# Patient Record
Sex: Male | Born: 1935 | Race: Black or African American | Hispanic: No | Marital: Single
Health system: Southern US, Community
[De-identification: ages and names within clinical notes are randomized; demographics above are authoritative.]

## PROBLEM LIST (undated history)

## (undated) DIAGNOSIS — M1990 Unspecified osteoarthritis, unspecified site: Secondary | ICD-10-CM

## (undated) DIAGNOSIS — N400 Enlarged prostate without lower urinary tract symptoms: Secondary | ICD-10-CM

## (undated) DIAGNOSIS — I2510 Atherosclerotic heart disease of native coronary artery without angina pectoris: Secondary | ICD-10-CM

## (undated) HISTORY — PX: THORACOTOMY: SUR1349

## (undated) HISTORY — PX: COLONOSCOPY: SHX174

---

## 2019-07-13 ENCOUNTER — Other Ambulatory Visit: Payer: Self-pay

## 2019-07-13 ENCOUNTER — Emergency Department (HOSPITAL_COMMUNITY)
Admission: EM | Admit: 2019-07-13 | Discharge: 2019-07-13 | Disposition: A | Discharge status: Home / Self Care | Payer: Medicare Other | Attending: Emergency Medicine | Admitting: Emergency Medicine

## 2019-07-13 ENCOUNTER — Encounter (HOSPITAL_COMMUNITY): Payer: Self-pay

## 2019-07-13 DIAGNOSIS — R1030 Lower abdominal pain, unspecified: Secondary | ICD-10-CM

## 2019-07-13 DIAGNOSIS — F0390 Unspecified dementia without behavioral disturbance: Secondary | ICD-10-CM | POA: Diagnosis not present

## 2019-07-13 DIAGNOSIS — Z79899 Other long term (current) drug therapy: Secondary | ICD-10-CM | POA: Diagnosis not present

## 2019-07-13 HISTORY — DX: Atherosclerotic heart disease of native coronary artery without angina pectoris: I25.10

## 2019-07-13 HISTORY — DX: Unspecified osteoarthritis, unspecified site: M19.90

## 2019-07-13 HISTORY — DX: Benign prostatic hyperplasia without lower urinary tract symptoms: N40.0

## 2019-07-13 LAB — CBC WITH DIFFERENTIAL/PLATELET
Abs Immature Granulocytes: 0.01 10*3/uL (ref 0.00–0.07)
Basophils Absolute: 0 10*3/uL (ref 0.0–0.1)
Basophils Relative: 0 %
Eosinophils Absolute: 0.3 10*3/uL (ref 0.0–0.5)
Eosinophils Relative: 6 %
HCT: 39.3 % (ref 39.0–52.0)
Hemoglobin: 12.9 g/dL — ABNORMAL LOW (ref 13.0–17.0)
Immature Granulocytes: 0 %
Lymphocytes Relative: 25 %
Lymphs Abs: 1.2 10*3/uL (ref 0.7–4.0)
MCH: 30.8 pg (ref 26.0–34.0)
MCHC: 32.8 g/dL (ref 30.0–36.0)
MCV: 93.8 fL (ref 80.0–100.0)
Monocytes Absolute: 0.4 10*3/uL (ref 0.1–1.0)
Monocytes Relative: 8 %
Neutro Abs: 3.1 10*3/uL (ref 1.7–7.7)
Neutrophils Relative %: 61 %
Platelets: 212 10*3/uL (ref 150–400)
RBC: 4.19 MIL/uL — ABNORMAL LOW (ref 4.22–5.81)
RDW: 13.3 % (ref 11.5–15.5)
WBC: 5 10*3/uL (ref 4.0–10.5)
nRBC: 0 % (ref 0.0–0.2)

## 2019-07-13 LAB — COMPREHENSIVE METABOLIC PANEL
ALT: 8 U/L (ref 0–44)
AST: 30 U/L (ref 15–41)
Albumin: 4.3 g/dL (ref 3.5–5.0)
Alkaline Phosphatase: 65 U/L (ref 38–126)
Anion gap: 9 (ref 5–15)
BUN: 14 mg/dL (ref 8–23)
CO2: 25 mmol/L (ref 22–32)
Calcium: 9.3 mg/dL (ref 8.9–10.3)
Chloride: 105 mmol/L (ref 98–111)
Creatinine, Ser: 0.88 mg/dL (ref 0.61–1.24)
GFR calc Af Amer: >60 mL/min (ref >60)
GFR calc non Af Amer: >60 mL/min (ref >60)
Glucose, Bld: 102 mg/dL — ABNORMAL HIGH (ref 70–99)
Potassium: 4.6 mmol/L (ref 3.5–5.1)
Sodium: 139 mmol/L (ref 135–145)
Total Bilirubin: 0.7 mg/dL (ref 0.3–1.2)
Total Protein: 7.9 g/dL (ref 6.5–8.1)

## 2019-07-13 LAB — URINALYSIS, ROUTINE W REFLEX MICROSCOPIC
Bilirubin Urine: NEGATIVE
Glucose, UA: NEGATIVE mg/dL
Hgb urine dipstick: NEGATIVE
Ketones, ur: NEGATIVE mg/dL
Leukocytes,Ua: NEGATIVE
Nitrite: NEGATIVE
Protein, ur: NEGATIVE mg/dL
Specific Gravity, Urine: 1.012 (ref 1.005–1.030)
pH: 7 (ref 5.0–8.0)

## 2019-07-13 MED ORDER — SODIUM CHLORIDE 0.9 % IV BOLUS: 500.0000 mL | Freq: Once | INTRAVENOUS | Status: DC

## 2019-07-13 MED ORDER — LIDOCAINE 5 % EX PTCH: 1.0000 | MEDICATED_PATCH | CUTANEOUS | 0 refills | Status: AC

## 2019-07-13 MED ORDER — TRAMADOL HCL 50 MG PO TABS: 50.0000 mg | ORAL_TABLET | Freq: Two times a day (BID) | ORAL | 0 refills | Status: DC | PRN

## 2019-07-13 NOTE — ED Notes (Signed)
Pt becoming more confused and agitated.  Talking to sister who is not in the room.   Ready for DC.

## 2019-07-13 NOTE — ED Provider Notes (Addendum)
[facility] EMERGENCY DEPARTMENT Provider Note   CSN: [id number] Arrival date & time: 07/13/19  1235     History   Chief Complaint No chief complaint on file.   HPI Randy Chandler is a 85 y.o. male.     Patient brought by EMS from [facility] memory care facility.  Patient with history of dementia.  Reported lower abdominal pain over the past 3 days with difficulty in voiding.  Patient records show history of enlarged prostate.  No reported fevers, nausea, vomiting, diarrhea.  No blood in the urine.  Level 5 caveat due to dementia.     No past medical history on file.  There are no active problems to display for this patient.   The histories are not reviewed yet. Please review them in the "History" navigator section and refresh this Crown.      Home Medications    Prior to Admission medications   Medication Sig Start Date End Date Taking? Authorizing Provider  acetaminophen (TYLENOL) 500 MG tablet Take 1,000 mg by mouth 2 (two) times daily.   Yes [provider]  amLODipine (NORVASC) 2.5 MG tablet Take 2.5 mg by mouth daily.   Yes [provider]  dorzolamide-timolol (COSOPT) 22.3-6.8 MG/ML ophthalmic solution Place 1 drop into both eyes 2 (two) times daily.   Yes [provider]  latanoprost (XALATAN) 0.005 % ophthalmic solution Place 1 drop into both eyes at bedtime.   Yes [provider]  LORazepam (ATIVAN) 0.5 MG tablet Take 0.5 mg by mouth at bedtime.   Yes [provider]  naproxen sodium (ALEVE) 220 MG tablet Take 220 mg by mouth 2 (two) times daily.   Yes [provider]  QUEtiapine (SEROQUEL) 25 MG tablet Take 12.5-50 mg by mouth See admin instructions. 12.5mg  in the morning and 50mg  at bedtime   Yes [provider]  tamsulosin (FLOMAX) 0.4 MG CAPS capsule Take 0.4 mg by mouth daily after supper.   Yes [provider]    Family History No family history on file.   Social History Social History   Tobacco Use  . Smoking status: Not on file  Substance Use Topics  . Alcohol use: Not on file  . Drug use: Not on file     Allergies   Patient has no allergy information on record.   Review of Systems Review of Systems  Unable to perform ROS: Dementia     Physical Exam Updated Vital Signs BP (!) 170/97   Pulse 69   Temp 98.2 F (36.8 C) (Oral)   Resp 16   SpO2 97%   Physical Exam Vitals signs and nursing note reviewed.  Constitutional:      Appearance: He is well-developed.  HENT:     Head: Normocephalic and atraumatic.  Eyes:     General:        Right eye: No discharge.        Left eye: No discharge.     Conjunctiva/sclera: Conjunctivae normal.  Neck:     Musculoskeletal: Normal range of motion and neck supple.  Cardiovascular:     Rate and Rhythm: Normal rate and regular rhythm.     Heart sounds: Normal heart sounds.  Pulmonary:     Effort: Pulmonary effort is normal.     Breath sounds: Normal breath sounds.  Abdominal:     Palpations: Abdomen is soft.     Tenderness: There is no abdominal tenderness.     Comments: No tenderness  to palpation.  Skin:    General: Skin is warm and dry.  Neurological:     General: No focal deficit present.     Mental Status: He is alert.     Comments: Patient is pleasant, somewhat confused at baseline.      ED Treatments / Results  Labs (all labs ordered are listed, but only abnormal results are displayed) Labs Reviewed  CBC WITH DIFFERENTIAL/PLATELET - Abnormal; Notable for the following components:      Result Value   RBC 4.19 (*)    Hemoglobin 12.9 (*)    All other components within normal limits  COMPREHENSIVE METABOLIC PANEL - Abnormal; Notable for the following components:   Glucose, Bld 102 (*)    All other components within normal limits  URINALYSIS, ROUTINE W REFLEX MICROSCOPIC - Abnormal; Notable for the following components:   Color, Urine STRAW (*)    All other  components within normal limits    EKG None  Radiology No results found.  Procedures Procedures (including critical care time)  Medications Ordered in ED Medications  sodium chloride 0.9 % bolus 500 mL (500 mLs Intravenous Not Given 07/13/19 1417)     Initial Impression / Assessment and Plan / ED Course  I have reviewed the triage vital signs and the nursing notes.  Pertinent labs & imaging results that were available during my care of the patient were reviewed by me and considered in my medical decision making (see chart for details).        Patient seen and examined. Work-up initiated. Medications reviewed.   Vital signs reviewed and are as follows: BP (!) 170/97   Pulse 69   Temp 98.2 F (36.8 C) (Oral)   Resp 16   SpO2 97%   2:44 PM patient discussed with and seen by Dr. Vanita Panda.  CBC, CMP, UA are all normal.  No signs of infection.  Abdomen is soft and nontender.  No indications for admission at this time.  No indications for CT or further work-up now. Will d/c back to facility.   The patient was urged to return to the Emergency Department immediately with worsening of current symptoms, worsening abdominal pain, persistent vomiting, blood noted in stools, fever, or any other concerns. The patient verbalized understanding.   3:48 PM I had a good discussion with patient's daughter who is an Administrator, [employer].    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  Patient is noted to wince when he sits up on the side of the bed.  We discussed all results regarding his abdominal work-up today and she is in agreement that we do not need to do further evaluation.  Patient has used Lidoderm patches and tramadol in the past.  He has not had any complications from the tramadol.  Will prescribe both Lidoderm and tramadol.  Tramadol is to only be used in case of severe breakthrough pain not relieved with  prescribed naproxen, acetaminophen, and Lidoderm patches.  Daughter is in agreement with this.  He has a follow-up on Wednesday.  She also noted at the facility is new for him and they do not know him that well yet.   Final Clinical Impressions(s) / ED Diagnoses   Final diagnoses:  Lower abdominal pain   Patient with abdominal pain. Vitals are stable, no fever. Labs normal. Imaging not felt indicated . No signs of dehydration, patient is tolerating PO's. Lungs are  clear and no signs suggestive of PNA. Low concern for appendicitis, cholecystitis, pancreatitis, ruptured viscus, UTI, kidney stone, aortic dissection, aortic aneurysm or other emergent abdominal etiology. Supportive therapy indicated with return if symptoms worsen.      ED Discharge Orders         Ordered    traMADol (ULTRAM) 50 MG tablet  Every 12 hours PRN     07/13/19 1545    lidocaine (LIDODERM) 5 %  Every 24 hours     07/13/19 1545             Carlisle Cater, PA-C 07/13/19 1552    Carmin Muskrat, MD 07/15/19 1423

## 2019-07-13 NOTE — ED Triage Notes (Signed)
C/o 3 day hx of lower abd pain and groin tenderness.  Difficulty in emptying bladder.  No N/V/D.  Pt alert to person only, cooperative but does not want to get undressed.  No outward pain distress.

## 2019-07-13 NOTE — Discharge Instructions (Signed)
Please read and follow all provided instructions.  Your diagnoses today include:  1. Lower abdominal pain     Tests performed today include:  Blood counts and electrolytes  Blood tests to check liver and kidney function  Urine test to look for infection - no infection  Vital signs. See below for your results today.   Medications prescribed:   None  Take any prescribed medications only as directed.  Home care instructions:   Follow any educational materials contained in this packet.  Follow-up instructions: Please follow-up with your primary care provider in the next 3 days for further evaluation of your symptoms if not getting better.   Return instructions:  SEEK IMMEDIATE MEDICAL ATTENTION IF:  The pain does not go away or becomes severe   A temperature above 101F develops   Repeated vomiting occurs (multiple episodes)   The pain becomes localized to portions of the abdomen. The right side could possibly be appendicitis. In an adult, the left lower portion of the abdomen could be colitis or diverticulitis.   Blood is being passed in stools or vomit (bright red or black tarry stools)   You develop chest pain, difficulty breathing, dizziness or fainting, or become confused, poorly responsive, or inconsolable (young children)  If you have any other emergent concerns regarding your health  Additional Information: Abdominal (belly) pain can be caused by many things. Your caregiver performed an examination and possibly ordered blood/urine tests and imaging (CT scan, x-rays, ultrasound). Many cases can be observed and treated at home after initial evaluation in the emergency department. Even though you are being discharged home, abdominal pain can be unpredictable. Therefore, you need a repeated exam if your pain does not resolve, returns, or worsens. Most patients with abdominal pain don't have to be admitted to the hospital or have surgery, but serious problems like  appendicitis and gallbladder attacks can start out as nonspecific pain. Many abdominal conditions cannot be diagnosed in one visit, so follow-up evaluations are very important.  Your vital signs today were: BP (!) 170/97    Pulse 69    Temp 98.2 F (36.8 C) (Oral)    Resp 16    SpO2 97%  If your blood pressure (bp) was elevated above 135/85 this visit, please have this repeated by your doctor within one month. --------------

## 2019-07-13 NOTE — ED Notes (Signed)
Pt attempting to get up OOB, able to redirect and get dressed.  [facility] called to transport back to [facility].  Pt had also pulled off condom cath and was incontinent of urine.   Attends applied.

## 2019-07-13 NOTE — ED Notes (Signed)
Pt able to urinate with condom cath in place.   Does c/o pain with emptying bladder.  Attempt to flush IV and area infiltrated.  No need for bolus now that pt has given sample.

## 2019-07-13 NOTE — ED Notes (Addendum)
Daughter called in for update, Merrily Pew PA was able to speak with her.  Lunch offered to patient with fluids.   Able to eat without gross need for (A).

## 2019-09-08 ENCOUNTER — Other Ambulatory Visit: Payer: Self-pay

## 2019-09-08 ENCOUNTER — Encounter (HOSPITAL_COMMUNITY): Payer: Self-pay

## 2019-09-08 ENCOUNTER — Emergency Department (HOSPITAL_COMMUNITY)
Admission: EM | Admit: 2019-09-08 | Discharge: 2019-09-08 | Disposition: A | Discharge status: Home / Self Care | Payer: Medicare Other | Attending: Emergency Medicine | Admitting: Emergency Medicine

## 2019-09-08 DIAGNOSIS — Z79899 Other long term (current) drug therapy: Secondary | ICD-10-CM | POA: Diagnosis not present

## 2019-09-08 DIAGNOSIS — I2510 Atherosclerotic heart disease of native coronary artery without angina pectoris: Secondary | ICD-10-CM | POA: Diagnosis not present

## 2019-09-08 DIAGNOSIS — R319 Hematuria, unspecified: Secondary | ICD-10-CM | POA: Diagnosis present

## 2019-09-08 DIAGNOSIS — N3091 Cystitis, unspecified with hematuria: Secondary | ICD-10-CM | POA: Diagnosis not present

## 2019-09-08 LAB — URINALYSIS, ROUTINE W REFLEX MICROSCOPIC
Bilirubin Urine: NEGATIVE
Glucose, UA: NEGATIVE mg/dL
Ketones, ur: NEGATIVE mg/dL
Nitrite: NEGATIVE
Protein, ur: 30 mg/dL — AB
RBC / HPF: >50 RBC/hpf — ABNORMAL HIGH (ref 0–5)
Specific Gravity, Urine: 1.013 (ref 1.005–1.030)
WBC, UA: >50 WBC/hpf — ABNORMAL HIGH (ref 0–5)
pH: 8 (ref 5.0–8.0)

## 2019-09-08 LAB — BASIC METABOLIC PANEL
Anion gap: 7 (ref 5–15)
BUN: 16 mg/dL (ref 8–23)
CO2: 26 mmol/L (ref 22–32)
Calcium: 9.4 mg/dL (ref 8.9–10.3)
Chloride: 105 mmol/L (ref 98–111)
Creatinine, Ser: 0.77 mg/dL (ref 0.61–1.24)
GFR calc Af Amer: >60 mL/min (ref >60)
GFR calc non Af Amer: >60 mL/min (ref >60)
Glucose, Bld: 87 mg/dL (ref 70–99)
Potassium: 3.8 mmol/L (ref 3.5–5.1)
Sodium: 138 mmol/L (ref 135–145)

## 2019-09-08 LAB — CBC WITH DIFFERENTIAL/PLATELET
Abs Immature Granulocytes: 0.01 10*3/uL (ref 0.00–0.07)
Basophils Absolute: 0 10*3/uL (ref 0.0–0.1)
Basophils Relative: 0 %
Eosinophils Absolute: 0.3 10*3/uL (ref 0.0–0.5)
Eosinophils Relative: 6 %
HCT: 39.5 % (ref 39.0–52.0)
Hemoglobin: 12.7 g/dL — ABNORMAL LOW (ref 13.0–17.0)
Immature Granulocytes: 0 %
Lymphocytes Relative: 26 %
Lymphs Abs: 1.3 10*3/uL (ref 0.7–4.0)
MCH: 30.5 pg (ref 26.0–34.0)
MCHC: 32.2 g/dL (ref 30.0–36.0)
MCV: 95 fL (ref 80.0–100.0)
Monocytes Absolute: 0.5 10*3/uL (ref 0.1–1.0)
Monocytes Relative: 9 %
Neutro Abs: 2.9 10*3/uL (ref 1.7–7.7)
Neutrophils Relative %: 59 %
Platelets: 246 10*3/uL (ref 150–400)
RBC: 4.16 MIL/uL — ABNORMAL LOW (ref 4.22–5.81)
RDW: 13.8 % (ref 11.5–15.5)
WBC: 5 10*3/uL (ref 4.0–10.5)
nRBC: 0 % (ref 0.0–0.2)

## 2019-09-08 MED ORDER — CEPHALEXIN 500 MG PO CAPS: 500.0000 mg | ORAL_CAPSULE | Freq: Two times a day (BID) | ORAL | 0 refills | Status: DC

## 2019-09-08 MED ORDER — STERILE WATER FOR INJECTION IJ SOLN
INTRAMUSCULAR | Status: AC
  Administered 2019-09-08: 05:00:00
  Filled 2019-09-08: qty 10

## 2019-09-08 MED ORDER — CEFTRIAXONE SODIUM 1 G IJ SOLR
1.0000 g | Freq: Once | INTRAMUSCULAR | Status: AC
  Administered 2019-09-08: 1 g via INTRAMUSCULAR
  Filled 2019-09-08: qty 10

## 2019-09-08 NOTE — ED Triage Notes (Signed)
BIB from EMS from [facility]. Pt complains of bilateral knee pain and hip pain. Pt normally ambulatory. Facility reports hematuria during an incontinent episode. Pt denies painful urination or abdominal pain.

## 2019-09-08 NOTE — ED Notes (Signed)
[facility] Called. Paperwork printed

## 2019-09-08 NOTE — ED Notes (Signed)
Discharge instruction given to [place], Building control surveyor at [facility]

## 2019-09-08 NOTE — ED Provider Notes (Signed)
[facility] DEPT Provider Note   CSN: [id number] Arrival date & time: 09/08/19  0240     History   Chief Complaint Chief Complaint  Patient presents with  . Knee Pain  . Hematuria    HPI Randy Chandler is a 85 y.o. male.     Patient sent to the ER from nursing facility for evaluation of hematuria.  Patient reportedly had an episode of incontinence earlier tonight and staff noticed blood in the urine.  Patient was unaware of this.  He seems slightly confused at arrival as to why he is here.  When asked why he is here he starts to indicate multiple areas of what appears to be chronic pain for him.  He reports that both of his knees hurt and also has had some pain with his left elbow.  Denies any trauma.     Past Medical History:  Diagnosis Date  . Arthritis   . BPH (benign prostatic hyperplasia)   . Coronary artery disease   . Osteoarthritis     There are no active problems to display for this patient.         Home Medications    Prior to Admission medications   Medication Sig Start Date End Date Taking? Authorizing Provider  acetaminophen (TYLENOL) 500 MG tablet Take 1,000 mg by mouth 2 (two) times daily.   Yes [provider]  amLODipine (NORVASC) 2.5 MG tablet Take 2.5 mg by mouth daily.   Yes [provider]  dorzolamide-timolol (COSOPT) 22.3-6.8 MG/ML ophthalmic solution Place 1 drop into both eyes 2 (two) times daily.   Yes [provider]  latanoprost (XALATAN) 0.005 % ophthalmic solution Place 1 drop into both eyes at bedtime.   Yes [provider]  LORazepam (ATIVAN) 0.5 MG tablet Take 0.5 mg by mouth at bedtime.   Yes [provider]  naproxen sodium (ALEVE) 220 MG tablet Take 220 mg by mouth 2 (two) times daily.   Yes [provider]  QUEtiapine (SEROQUEL) 25 MG tablet Take 12.5-50 mg by mouth See admin instructions. 12.5mg  in the morning and 50mg  at bedtime   Yes [provider]  tamsulosin (FLOMAX) 0.4 MG CAPS capsule Take 0.4 mg by mouth daily after supper.   Yes [provider]  traMADol (ULTRAM) 50 MG tablet Take 1 tablet (50 mg total) by mouth every 12 (twelve) hours as needed for severe pain (use for breakthrough pain if other medications are not helping). 07/13/19  Yes Carlisle Cater, PA-C  cephALEXin (KEFLEX) 500 MG capsule Take 1 capsule (500 mg total) by mouth 2 (two) times daily. 09/08/19   Orpah Greek, MD  lidocaine (LIDODERM) 5 % Place 1 patch onto the skin daily. Remove & Discard patch within 12 hours or as directed by MD 07/13/19   Carlisle Cater, PA-C    Family History No family history on file.  Social History Social History   Tobacco Use  . Smoking status: Not on file  Substance Use Topics  . Alcohol use: Not on file  . Drug use: Not on file     Allergies   Hytrin [terazosin]   Review of Systems Review of Systems  Genitourinary: Positive for hematuria.  Musculoskeletal: Positive for arthralgias.  All other systems reviewed and are negative.    Physical Exam Updated Vital Signs BP (!) 163/94   Pulse 64   Temp 97.9 F (36.6 C) (Oral)   Resp 13   SpO2 100%  Physical Exam Vitals signs and nursing note reviewed.  Constitutional:      General: He is not in acute distress.    Appearance: Normal appearance. He is well-developed.  HENT:     Head: Normocephalic and atraumatic.     Right Ear: Hearing normal.     Left Ear: Hearing normal.     Nose: Nose normal.  Eyes:     Conjunctiva/sclera: Conjunctivae normal.     Pupils: Pupils are equal, round, and reactive to light.  Neck:     Musculoskeletal: Normal range of motion and neck supple.  Cardiovascular:     Rate and Rhythm: Regular rhythm.     Heart sounds: S1 normal and S2 normal. No murmur. No friction rub. No gallop.   Pulmonary:     Effort: Pulmonary effort is normal. No respiratory distress.     Breath sounds: Normal breath sounds.   Chest:     Chest wall: No tenderness.  Abdominal:     General: Bowel sounds are normal.     Palpations: Abdomen is soft.     Tenderness: There is no abdominal tenderness. There is no guarding or rebound. Negative signs include Murphy's sign and McBurney's sign.     Hernia: No hernia is present.  Musculoskeletal: Normal range of motion.     Left elbow: He exhibits normal range of motion, no swelling and no deformity.     Right knee: He exhibits normal range of motion, no swelling, no effusion, no deformity and no erythema.     Left knee: He exhibits normal range of motion, no swelling, no effusion, no deformity and no erythema.  Skin:    General: Skin is warm and dry.     Findings: No rash.  Neurological:     Mental Status: He is alert and oriented to person, place, and time.     GCS: GCS eye subscore is 4. GCS verbal subscore is 5. GCS motor subscore is 6.     Cranial Nerves: No cranial nerve deficit.     Sensory: No sensory deficit.     Coordination: Coordination normal.  Psychiatric:        Speech: Speech normal.        Behavior: Behavior normal.        Thought Content: Thought content normal.      ED Treatments / Results  Labs (all labs ordered are listed, but only abnormal results are displayed) Labs Reviewed  CBC WITH DIFFERENTIAL/PLATELET - Abnormal; Notable for the following components:      Result Value   RBC 4.16 (*)    Hemoglobin 12.7 (*)    All other components within normal limits  URINALYSIS, ROUTINE W REFLEX MICROSCOPIC - Abnormal; Notable for the following components:   APPearance HAZY (*)    Hgb urine dipstick LARGE (*)    Protein, ur 30 (*)    Leukocytes,Ua MODERATE (*)    RBC / HPF >50 (*)    WBC, UA >50 (*)    Bacteria, UA FEW (*)    All other components within normal limits  URINE CULTURE  BASIC METABOLIC PANEL    EKG None  Radiology No results found.  Procedures Procedures (including critical care time)  Medications Ordered in ED  Medications  cefTRIAXone (ROCEPHIN) injection 1 g (has no administration in time range)  sterile water (preservative free) injection (has no administration in time range)     Initial Impression / Assessment and Plan / ED Course  I have reviewed  the triage vital signs and the nursing notes.  Pertinent labs & imaging results that were available during my care of the patient were reviewed by me and considered in my medical decision making (see chart for details).        Patient sent to the emergency department for evaluation of hematuria.  This was noticed tonight when he had an episode of urinary incontinence and staff noticed blood.  Patient was unaware of this.  He was not actually sure why he was here in the ER.  His only complaints was various areas of pain secondary to his chronic arthritis.  He complained of bilateral knee and left elbow pain.  Examination of these areas did not reveal any erythema, warmth, swelling, effusions or any concern for joint infection.  He has not had any trauma.  Lab work was very reassuring but his urinalysis does show hematuria with obvious infection.  As he appears well, no concern for sepsis, vital signs are stable, appropriate for management at the nursing home.  Final Clinical Impressions(s) / ED Diagnoses   Final diagnoses:  Hemorrhagic cystitis    ED Discharge Orders         Ordered    cephALEXin (KEFLEX) 500 MG capsule  2 times daily     09/08/19 0515           Orpah Greek, MD 09/08/19 [phone]

## 2019-09-08 NOTE — ED Notes (Signed)
Called lab to add Urine Culture.

## 2019-09-08 NOTE — ED Notes (Signed)
Pt provided with peri care and fresh linens after arriving to ED in a soiled brief. Blood noted and Seth Bake, RN made aware. Pt provided with a condom cath for voiding needs. Pt has call bell within reach. Given warm blankets.

## 2019-09-08 NOTE — ED Notes (Signed)
Attempted to call report to [facility] with no answer  

## 2019-09-10 LAB — URINE CULTURE: Culture: >=100000 — AB

## 2019-09-11 ENCOUNTER — Telehealth: Payer: Self-pay

## 2019-09-11 NOTE — Telephone Encounter (Signed)
Post ED Visit - Positive Culture Follow-up  Culture report reviewed by antimicrobial stewardship pharmacist: [facility] []  [address] Pharm.D. []  Heide Guile, Pharm.D., BCPS AQ-ID []  Parks Neptune, Pharm.D., BCPS []  Alycia Rossetti, Pharm.D., BCPS []  [place], Pharm.D., BCPS, AAHIVP []  Legrand Como, Pharm.D., BCPS, AAHIVP []  Salome Arnt, PharmD, BCPS []  Johnnette Gourd, PharmD, BCPS []  Hughes Better, PharmD, BCPS []  Leeroy Cha, PharmD []  Laqueta Linden, PharmD, BCPS []  Albertina Parr, PharmD  [facility] Team [x]  Leodis Sias, PharmD []  Lindell Spar, PharmD []  Royetta Asal, PharmD []  Graylin Shiver, Rph []  Rema Fendt) Glennon Mac, PharmD []  Arlyn Dunning, PharmD []  Netta Cedars, PharmD []  Dia Sitter, PharmD []  Leone Haven, PharmD []  Gretta Arab, PharmD []  Theodis Shove, PharmD []  Peggyann Juba, PharmD []  Reuel Boom, PharmD   Positive urine culture Treated with Cephalexin, organism sensitive to the same and no further patient follow-up is required at this time.  Genia Del 09/11/2019, 8:48 AM

## 2019-10-25 ENCOUNTER — Emergency Department (HOSPITAL_COMMUNITY): Payer: Medicare Other

## 2019-10-25 ENCOUNTER — Encounter (HOSPITAL_COMMUNITY): Payer: Self-pay | Admitting: *Deleted

## 2019-10-25 ENCOUNTER — Inpatient Hospital Stay (HOSPITAL_COMMUNITY)
Admission: EM | Admit: 2019-10-25 | Discharge: 2019-11-05 | DRG: 177 | Disposition: A | Discharge status: Skilled Nursing Facility | Payer: Medicare Other | Source: Skilled Nursing Facility | Attending: Internal Medicine | Admitting: Internal Medicine

## 2019-10-25 DIAGNOSIS — E861 Hypovolemia: Secondary | ICD-10-CM | POA: Diagnosis present

## 2019-10-25 DIAGNOSIS — M1990 Unspecified osteoarthritis, unspecified site: Secondary | ICD-10-CM | POA: Diagnosis present

## 2019-10-25 DIAGNOSIS — E162 Hypoglycemia, unspecified: Secondary | ICD-10-CM | POA: Diagnosis present

## 2019-10-25 DIAGNOSIS — C8510 Unspecified B-cell lymphoma, unspecified site: Secondary | ICD-10-CM | POA: Diagnosis present

## 2019-10-25 DIAGNOSIS — Z833 Family history of diabetes mellitus: Secondary | ICD-10-CM | POA: Diagnosis not present

## 2019-10-25 DIAGNOSIS — R531 Weakness: Secondary | ICD-10-CM | POA: Diagnosis not present

## 2019-10-25 DIAGNOSIS — Z79899 Other long term (current) drug therapy: Secondary | ICD-10-CM

## 2019-10-25 DIAGNOSIS — Z888 Allergy status to other drugs, medicaments and biological substances status: Secondary | ICD-10-CM | POA: Diagnosis not present

## 2019-10-25 DIAGNOSIS — G9341 Metabolic encephalopathy: Secondary | ICD-10-CM | POA: Diagnosis present

## 2019-10-25 DIAGNOSIS — E870 Hyperosmolality and hypernatremia: Secondary | ICD-10-CM | POA: Diagnosis present

## 2019-10-25 DIAGNOSIS — Z800 Family history of malignant neoplasm of digestive organs: Secondary | ICD-10-CM

## 2019-10-25 DIAGNOSIS — N400 Enlarged prostate without lower urinary tract symptoms: Secondary | ICD-10-CM | POA: Diagnosis present

## 2019-10-25 DIAGNOSIS — R627 Adult failure to thrive: Secondary | ICD-10-CM | POA: Diagnosis not present

## 2019-10-25 DIAGNOSIS — R5381 Other malaise: Secondary | ICD-10-CM | POA: Diagnosis present

## 2019-10-25 DIAGNOSIS — E785 Hyperlipidemia, unspecified: Secondary | ICD-10-CM | POA: Diagnosis present

## 2019-10-25 DIAGNOSIS — Z8249 Family history of ischemic heart disease and other diseases of the circulatory system: Secondary | ICD-10-CM | POA: Diagnosis not present

## 2019-10-25 DIAGNOSIS — E860 Dehydration: Secondary | ICD-10-CM | POA: Diagnosis present

## 2019-10-25 DIAGNOSIS — Z791 Long term (current) use of non-steroidal anti-inflammatories (NSAID): Secondary | ICD-10-CM

## 2019-10-25 DIAGNOSIS — F0280 Dementia in other diseases classified elsewhere without behavioral disturbance: Secondary | ICD-10-CM | POA: Diagnosis present

## 2019-10-25 DIAGNOSIS — U071 COVID-19: Principal | ICD-10-CM | POA: Diagnosis present

## 2019-10-25 DIAGNOSIS — R739 Hyperglycemia, unspecified: Secondary | ICD-10-CM | POA: Diagnosis not present

## 2019-10-25 DIAGNOSIS — I10 Essential (primary) hypertension: Secondary | ICD-10-CM | POA: Diagnosis present

## 2019-10-25 DIAGNOSIS — R0602 Shortness of breath: Secondary | ICD-10-CM

## 2019-10-25 DIAGNOSIS — Z79891 Long term (current) use of opiate analgesic: Secondary | ICD-10-CM

## 2019-10-25 DIAGNOSIS — Z6821 Body mass index (BMI) 21.0-21.9, adult: Secondary | ICD-10-CM

## 2019-10-25 DIAGNOSIS — Z66 Do not resuscitate: Secondary | ICD-10-CM | POA: Diagnosis present

## 2019-10-25 DIAGNOSIS — I2510 Atherosclerotic heart disease of native coronary artery without angina pectoris: Secondary | ICD-10-CM | POA: Diagnosis present

## 2019-10-25 DIAGNOSIS — E871 Hypo-osmolality and hyponatremia: Secondary | ICD-10-CM | POA: Diagnosis not present

## 2019-10-25 DIAGNOSIS — E878 Other disorders of electrolyte and fluid balance, not elsewhere classified: Secondary | ICD-10-CM | POA: Diagnosis present

## 2019-10-25 DIAGNOSIS — H409 Unspecified glaucoma: Secondary | ICD-10-CM | POA: Diagnosis present

## 2019-10-25 DIAGNOSIS — N179 Acute kidney failure, unspecified: Secondary | ICD-10-CM | POA: Diagnosis present

## 2019-10-25 LAB — URINALYSIS, ROUTINE W REFLEX MICROSCOPIC
Bacteria, UA: NONE SEEN
Bilirubin Urine: NEGATIVE
Glucose, UA: NEGATIVE mg/dL
Ketones, ur: NEGATIVE mg/dL
Leukocytes,Ua: NEGATIVE
Nitrite: NEGATIVE
Protein, ur: 100 mg/dL — AB
Specific Gravity, Urine: 1.025 (ref 1.005–1.030)
pH: 5 (ref 5.0–8.0)

## 2019-10-25 LAB — CBC WITH DIFFERENTIAL/PLATELET
Abs Immature Granulocytes: 0.11 10*3/uL — ABNORMAL HIGH (ref 0.00–0.07)
Basophils Absolute: 0 10*3/uL (ref 0.0–0.1)
Basophils Relative: 0 %
Eosinophils Absolute: 0 10*3/uL (ref 0.0–0.5)
Eosinophils Relative: 0 %
HCT: 47.9 % (ref 39.0–52.0)
Hemoglobin: 14.2 g/dL (ref 13.0–17.0)
Immature Granulocytes: 1 %
Lymphocytes Relative: 8 %
Lymphs Abs: 1.1 10*3/uL (ref 0.7–4.0)
MCH: 29.6 pg (ref 26.0–34.0)
MCHC: 29.6 g/dL — ABNORMAL LOW (ref 30.0–36.0)
MCV: 100 fL (ref 80.0–100.0)
Monocytes Absolute: 0.8 10*3/uL (ref 0.1–1.0)
Monocytes Relative: 6 %
Neutro Abs: 11.2 10*3/uL — ABNORMAL HIGH (ref 1.7–7.7)
Neutrophils Relative %: 85 %
Platelets: 351 10*3/uL (ref 150–400)
RBC: 4.79 MIL/uL (ref 4.22–5.81)
RDW: 14.1 % (ref 11.5–15.5)
WBC: 13.3 10*3/uL — ABNORMAL HIGH (ref 4.0–10.5)
nRBC: 0 % (ref 0.0–0.2)

## 2019-10-25 LAB — BASIC METABOLIC PANEL
Anion gap: 12 (ref 5–15)
Anion gap: 13 (ref 5–15)
BUN: 62 mg/dL — ABNORMAL HIGH (ref 8–23)
BUN: 52 mg/dL — ABNORMAL HIGH (ref 8–23)
CO2: 30 mmol/L (ref 22–32)
CO2: 25 mmol/L (ref 22–32)
Calcium: 10 mg/dL (ref 8.9–10.3)
Calcium: 9.7 mg/dL (ref 8.9–10.3)
Chloride: 120 mmol/L — ABNORMAL HIGH (ref 98–111)
Chloride: 126 mmol/L — ABNORMAL HIGH (ref 98–111)
Creatinine, Ser: 1.55 mg/dL — ABNORMAL HIGH (ref 0.61–1.24)
Creatinine, Ser: 1.48 mg/dL — ABNORMAL HIGH (ref 0.61–1.24)
GFR calc Af Amer: 46 mL/min — ABNORMAL LOW (ref >60)
GFR calc Af Amer: 49 mL/min — ABNORMAL LOW (ref >60)
GFR calc non Af Amer: 40 mL/min — ABNORMAL LOW (ref >60)
GFR calc non Af Amer: 42 mL/min — ABNORMAL LOW (ref >60)
Glucose, Bld: 110 mg/dL — ABNORMAL HIGH (ref 70–99)
Glucose, Bld: 111 mg/dL — ABNORMAL HIGH (ref 70–99)
Potassium: 4.5 mmol/L (ref 3.5–5.1)
Potassium: 4.8 mmol/L (ref 3.5–5.1)
Sodium: 162 mmol/L (ref 135–145)
Sodium: 164 mmol/L (ref 135–145)

## 2019-10-25 LAB — D-DIMER, QUANTITATIVE: D-Dimer, Quant: 18.29 ug/mL-FEU — ABNORMAL HIGH (ref 0.00–0.50)

## 2019-10-25 LAB — TSH: TSH: 1.85 u[IU]/mL (ref 0.350–4.500)

## 2019-10-25 LAB — HEPATITIS B SURFACE ANTIGEN: Hepatitis B Surface Ag: NONREACTIVE

## 2019-10-25 LAB — LACTIC ACID, PLASMA: Lactic Acid, Venous: 2.1 mmol/L (ref 0.5–1.9)

## 2019-10-25 LAB — CK: Total CK: 408 U/L — ABNORMAL HIGH (ref 49–397)

## 2019-10-25 LAB — PROCALCITONIN: Procalcitonin: 0.19 ng/mL

## 2019-10-25 LAB — GLUCOSE, CAPILLARY
Glucose-Capillary: 116 mg/dL — ABNORMAL HIGH (ref 70–99)
Glucose-Capillary: 91 mg/dL (ref 70–99)

## 2019-10-25 LAB — TROPONIN I (HIGH SENSITIVITY): Troponin I (High Sensitivity): 25 ng/L — ABNORMAL HIGH (ref <18)

## 2019-10-25 LAB — LIPASE, BLOOD: Lipase: 36 U/L (ref 11–51)

## 2019-10-25 LAB — SEDIMENTATION RATE: Sed Rate: 112 mm/hr — ABNORMAL HIGH (ref 0–16)

## 2019-10-25 LAB — C-REACTIVE PROTEIN: CRP: 21 mg/dL — ABNORMAL HIGH (ref <1.0)

## 2019-10-25 LAB — LACTATE DEHYDROGENASE: LDH: 321 U/L — ABNORMAL HIGH (ref 98–192)

## 2019-10-25 LAB — MRSA PCR SCREENING: MRSA by PCR: NEGATIVE

## 2019-10-25 LAB — FIBRINOGEN: Fibrinogen: >800 mg/dL — ABNORMAL HIGH (ref 210–475)

## 2019-10-25 LAB — FERRITIN: Ferritin: 1398 ng/mL — ABNORMAL HIGH (ref 24–336)

## 2019-10-25 MED ORDER — HEPARIN SODIUM (PORCINE) 5000 UNIT/ML IJ SOLN: 5000.0000 [IU] | Freq: Three times a day (TID) | INTRAMUSCULAR | Status: DC

## 2019-10-25 MED ORDER — ONDANSETRON HCL 4 MG/2ML IJ SOLN: 4.0000 mg | Freq: Four times a day (QID) | INTRAMUSCULAR | Status: DC | PRN

## 2019-10-25 MED ORDER — TRAZODONE HCL 50 MG PO TABS
50.0000 mg | ORAL_TABLET | Freq: Every day | ORAL | Status: DC
  Administered 2019-10-28 – 2019-10-29 (×2): 50 mg via ORAL
  Filled 2019-10-25 (×2): qty 1

## 2019-10-25 MED ORDER — ONDANSETRON HCL 4 MG PO TABS: 4.0000 mg | ORAL_TABLET | Freq: Four times a day (QID) | ORAL | Status: DC | PRN

## 2019-10-25 MED ORDER — POTASSIUM CL IN DEXTROSE 5% 20 MEQ/L IV SOLN
20.0000 meq | INTRAVENOUS | Status: DC
  Administered 2019-10-25: 20 meq via INTRAVENOUS
  Filled 2019-10-25: qty 1000

## 2019-10-25 MED ORDER — TRAMADOL HCL 50 MG PO TABS: 50.0000 mg | ORAL_TABLET | Freq: Two times a day (BID) | ORAL | Status: DC | PRN

## 2019-10-25 MED ORDER — HEPARIN SODIUM (PORCINE) 5000 UNIT/ML IJ SOLN
7500.0000 [IU] | Freq: Three times a day (TID) | INTRAMUSCULAR | Status: DC
  Administered 2019-10-25: 10000 [IU] via SUBCUTANEOUS
  Administered 2019-10-26 – 2019-11-02 (×22): 7500 [IU] via SUBCUTANEOUS
  Filled 2019-10-25 (×24): qty 2

## 2019-10-25 MED ORDER — LATANOPROST 0.005 % OP SOLN
1.0000 [drp] | Freq: Every day | OPHTHALMIC | Status: DC
  Administered 2019-10-25 – 2019-11-04 (×11): 1 [drp] via OPHTHALMIC
  Filled 2019-10-25 (×2): qty 2.5

## 2019-10-25 MED ORDER — ACETAMINOPHEN 325 MG PO TABS: 650.0000 mg | ORAL_TABLET | Freq: Four times a day (QID) | ORAL | Status: DC | PRN

## 2019-10-25 MED ORDER — DEXAMETHASONE SODIUM PHOSPHATE 10 MG/ML IJ SOLN
6.0000 mg | INTRAMUSCULAR | Status: DC
  Administered 2019-10-25 – 2019-10-26 (×2): 6 mg via INTRAVENOUS
  Filled 2019-10-25 (×2): qty 1

## 2019-10-25 MED ORDER — VITAMIN D3 25 MCG (1000 UNIT) PO TABS
5000.0000 [IU] | ORAL_TABLET | Freq: Every day | ORAL | Status: DC
  Administered 2019-10-29 – 2019-11-05 (×8): 5000 [IU] via ORAL
  Filled 2019-10-25 (×17): qty 5

## 2019-10-25 MED ORDER — SODIUM CHLORIDE 0.9 % IV BOLUS
1000.0000 mL | Freq: Once | INTRAVENOUS | Status: AC
  Administered 2019-10-25: 13:00:00 1000 mL via INTRAVENOUS

## 2019-10-25 MED ORDER — INSULIN ASPART 100 UNIT/ML ~~LOC~~ SOLN
0.0000 [IU] | SUBCUTANEOUS | Status: DC
  Administered 2019-10-26: 1 [IU] via SUBCUTANEOUS
  Administered 2019-10-26: 2 [IU] via SUBCUTANEOUS
  Administered 2019-10-27 (×3): 1 [IU] via SUBCUTANEOUS

## 2019-10-25 MED ORDER — SODIUM CHLORIDE 0.9% IV SOLUTION: Freq: Once | INTRAVENOUS | Status: AC

## 2019-10-25 MED ORDER — QUETIAPINE FUMARATE 25 MG PO TABS: 50.0000 mg | ORAL_TABLET | Freq: Every day | ORAL | Status: DC

## 2019-10-25 MED ORDER — QUETIAPINE FUMARATE 25 MG PO TABS
12.5000 mg | ORAL_TABLET | Freq: Every day | ORAL | Status: DC
  Administered 2019-10-29 – 2019-10-30 (×2): 12.5 mg via ORAL
  Filled 2019-10-25 (×3): qty 1

## 2019-10-25 MED ORDER — TAMSULOSIN HCL 0.4 MG PO CAPS
0.4000 mg | ORAL_CAPSULE | Freq: Every day | ORAL | Status: DC
  Administered 2019-10-30 – 2019-11-03 (×5): 0.4 mg via ORAL
  Filled 2019-10-25 (×5): qty 1

## 2019-10-25 MED ORDER — DORZOLAMIDE HCL-TIMOLOL MAL 2-0.5 % OP SOLN
1.0000 [drp] | Freq: Two times a day (BID) | OPHTHALMIC | Status: DC
  Administered 2019-10-25 – 2019-11-05 (×20): 1 [drp] via OPHTHALMIC
  Filled 2019-10-25 (×3): qty 10

## 2019-10-25 MED ORDER — LORAZEPAM 2 MG/ML IJ SOLN
0.5000 mg | Freq: Once | INTRAMUSCULAR | Status: AC
  Administered 2019-10-25: 0.5 mg via INTRAVENOUS
  Filled 2019-10-25: qty 1

## 2019-10-25 MED ORDER — POTASSIUM CL IN DEXTROSE 5% 20 MEQ/L IV SOLN
20.0000 meq | INTRAVENOUS | Status: DC
  Administered 2019-10-25 – 2019-10-27 (×4): 20 meq via INTRAVENOUS
  Filled 2019-10-25 (×5): qty 1000

## 2019-10-25 MED ORDER — ALBUTEROL SULFATE HFA 108 (90 BASE) MCG/ACT IN AERS
2.0000 | INHALATION_SPRAY | RESPIRATORY_TRACT | Status: DC | PRN
  Filled 2019-10-25: qty 6.7

## 2019-10-25 MED ORDER — ACETAMINOPHEN 650 MG RE SUPP: 650.0000 mg | Freq: Four times a day (QID) | RECTAL | Status: DC | PRN

## 2019-10-25 NOTE — ED Provider Notes (Signed)
Randy Chandler Provider Note   CSN: [id number] Arrival date & time: 10/25/19  1151     History Chief Complaint  Patient presents with  . Altered Mental Status    Randy Chandler is a 85 y.o. male.  The history is provided by medical records and a relative (patient's son in law). The history is limited by the condition of the patient.  Altered Mental Status Presenting symptoms: behavior changes   Severity:  Severe Most recent episode:  More than 2 days ago Episode history:  Single Duration:  4 days Timing:  Constant Progression:  Worsening Chronicity:  New Context: dementia, nursing home resident and recent infection (covid)   Associated symptoms: fever and weakness (generalized)        Past Medical History:  Diagnosis Date  . Arthritis   . BPH (benign prostatic hyperplasia)   . Coronary artery disease   . Osteoarthritis     There are no problems to display for this patient.   History reviewed. No pertinent surgical history.     No family history on file.  Social History   Tobacco Use  . Smoking status: Not on file  Substance Use Topics  . Alcohol use: Not on file  . Drug use: Not on file    Home Medications Prior to Admission medications   Medication Sig Start Date End Date Taking? Authorizing Provider  acetaminophen (TYLENOL) 500 MG tablet Take 1,000 mg by mouth 2 (two) times daily.    [provider]  amLODipine (NORVASC) 2.5 MG tablet Take 2.5 mg by mouth daily.    [provider]  cephALEXin (KEFLEX) 500 MG capsule Take 1 capsule (500 mg total) by mouth 2 (two) times daily. 09/08/19   Orpah Greek, MD  dorzolamide-timolol (COSOPT) 22.3-6.8 MG/ML ophthalmic solution Place 1 drop into both eyes 2 (two) times daily.    [provider]  latanoprost (XALATAN) 0.005 % ophthalmic solution Place 1 drop into both eyes at bedtime.    [provider]  lidocaine (LIDODERM) 5 % Place 1  patch onto the skin daily. Remove & Discard patch within 12 hours or as directed by MD 07/13/19   Carlisle Cater, PA-C  LORazepam (ATIVAN) 0.5 MG tablet Take 0.5 mg by mouth at bedtime.    [provider]  naproxen sodium (ALEVE) 220 MG tablet Take 220 mg by mouth 2 (two) times daily.    [provider]  QUEtiapine (SEROQUEL) 25 MG tablet Take 12.5-50 mg by mouth See admin instructions. 12.5mg  in the morning and 50mg  at bedtime    [provider]  tamsulosin (FLOMAX) 0.4 MG CAPS capsule Take 0.4 mg by mouth daily after supper.    [provider]  traMADol (ULTRAM) 50 MG tablet Take 1 tablet (50 mg total) by mouth every 12 (twelve) hours as needed for severe pain (use for breakthrough pain if other medications are not helping). 07/13/19   Carlisle Cater, PA-C    Allergies    Hytrin [terazosin]  Review of Systems   Review of Systems  Unable to perform ROS: Dementia  Constitutional: Positive for fatigue and fever.  Neurological: Positive for weakness (generalized).  All other systems reviewed and are negative.   Physical Exam Updated Vital Signs There were no vitals taken for this visit.  Physical Exam Vitals and nursing note reviewed.  Constitutional:      Appearance: He is ill-appearing. He is not toxic-appearing or diaphoretic.  HENT:     Head:  Normocephalic and atraumatic.     Nose: Nose normal. No congestion or rhinorrhea.     Mouth/Throat:     Mouth: Mucous membranes are dry.     Pharynx: No oropharyngeal exudate or posterior oropharyngeal erythema.  Eyes:     Conjunctiva/sclera: Conjunctivae normal.     Pupils: Pupils are equal, round, and reactive to light.  Cardiovascular:     Rate and Rhythm: Regular rhythm. Tachycardia present.     Pulses: Normal pulses.     Heart sounds: No murmur.  Pulmonary:     Effort: Pulmonary effort is normal.     Breath sounds: Rhonchi present. No wheezing or rales.  Chest:     Chest wall: No tenderness.    Abdominal:     General: Abdomen is flat.     Tenderness: There is no abdominal tenderness. There is no right CVA tenderness or left CVA tenderness.  Musculoskeletal:        General: No tenderness.     Right lower leg: No edema.     Left lower leg: No edema.  Skin:    Capillary Refill: Capillary refill takes less than 2 seconds.     Findings: No erythema or rash.  Neurological:     Mental Status: He is confused.     GCS: GCS eye subscore is 3. GCS verbal subscore is 2. GCS motor subscore is 5.     Cranial Nerves: No facial asymmetry.     Sensory: No sensory deficit.     Motor: No abnormal muscle tone.     Comments: Patient does not answer any questions but makes intelligible sounds.  Patient will localize to pain.  Patient opens eyes to voice.       ED Results / Procedures / Treatments   Labs (all labs ordered are listed, but only abnormal results are displayed) Labs Reviewed  CBC WITH DIFFERENTIAL/PLATELET - Abnormal; Notable for the following components:      Result Value   WBC 13.3 (*)    MCHC 29.6 (*)    Neutro Abs 11.2 (*)    Abs Immature Granulocytes 0.11 (*)    All other components within normal limits  COMPREHENSIVE METABOLIC PANEL - Abnormal; Notable for the following components:   Sodium 163 (*)    Chloride 119 (*)    Glucose, Bld 128 (*)    BUN 64 (*)    Creatinine, Ser 1.75 (*)    Total Protein 9.7 (*)    GFR calc non Af Amer 34 (*)    GFR calc Af Amer 40 (*)    All other components within normal limits  LACTIC ACID, PLASMA - Abnormal; Notable for the following components:   Lactic Acid, Venous 2.9 (*)    All other components within normal limits  CK - Abnormal; Notable for the following components:   Total CK 408 (*)    All other components within normal limits  URINALYSIS, ROUTINE W REFLEX MICROSCOPIC - Abnormal; Notable for the following components:   Hgb urine dipstick SMALL (*)    Protein, ur 100 (*)    All other components within normal limits   URINE CULTURE  SARS CORONAVIRUS 2 (TAT 6-24 HRS)  LIPASE, BLOOD  TSH  LACTIC ACID, PLASMA  BASIC METABOLIC PANEL  BASIC METABOLIC PANEL  BASIC METABOLIC PANEL  BASIC METABOLIC PANEL  PROCALCITONIN  LACTATE DEHYDROGENASE  D-DIMER, QUANTITATIVE (NOT AT [facility])  SEDIMENTATION RATE  C-REACTIVE PROTEIN  FIBRINOGEN  FERRITIN  PROCALCITONIN    EKG  EKG Interpretation  Date/Time:  Friday October 25 2019 13:01:54 EST Ventricular Rate:  107 PR Interval:    QRS Duration: 84 QT Interval:  351 QTC Calculation: 469 R Axis:   75 Text Interpretation: Sinus tachycardia Atrial premature complex Anteroseptal infarct, old No prior ECG for comparison. No STEMI Confirmed by Antony Blackbird [id number]) on 10/25/2019 3:16:14 PM   Radiology DG Chest Portable 1 View  Result Date: 10/25/2019 CLINICAL DATA:  COVID-19. EXAM: PORTABLE CHEST 1 VIEW COMPARISON:  None. FINDINGS: The heart size and mediastinal contours are within normal limits. Both lungs are clear. The visualized skeletal structures are unremarkable. IMPRESSION: No active disease. Electronically Signed   By: Marijo Conception M.D.   On: 10/25/2019 14:01    Procedures Procedures (including critical care time)  Medications Ordered in ED Medications  dextrose 5 % with KCl 20 mEq / L  infusion (has no administration in time range)  sodium chloride 0.9 % bolus 1,000 mL (1,000 mLs Intravenous New Bag/Given 10/25/19 1304)    ED Course  I have reviewed the triage vital signs and the nursing notes.  Pertinent labs & imaging results that were available during my care of the patient were reviewed by me and considered in my medical decision making (see chart for details).    MDM Rules/Calculators/A&P                        Foch Weida is a 85 y.o. male with a past medical history significant for CAD, arthritis, BPH, dementia, and current COVID-19 infection who presents from a nursing facility for fatigue, altered mental status, dehydration,  decreased p.o. intake, and decreased mobility.  Patient is unable to provide any information but I spoke with the patient's who is a physician in town who reports that normally the patient is talkative, ambulatory, goes to dancing classes, and is very interactive but over the last few days has had altered mental status with worsening fatigue.  Patient is not eating or drinking and is not getting out of bed.  He is not cooperating with staff.  He has looked more dehydrated according to the facility report to family.  He has not had significant nausea vomiting, or diarrhea.  Patient is unable to provide any information.  To his knowledge, the patient has not had any new coughing congestion or shortness of breath but patient was found to be tachycardic, febrile at 101.5, and had a soft blood pressure of 123456 systolic on arrival.  On my initial evaluation, oxygen saturation was 91% on room air at rest.  On my exam, patient did have some rhonchi in his lungs.  Chest and abdomen were nontender.  Patient is moving all extremities but does not want to do so.  He is agitated when he is interacted with.  He had symmetric pupils and did not want to follow commands.  He does appear to be protecting his airway.  Clinically I am concerned that the patient has either worsening COVID-19 infection causing his altered mental status and decreased oral intake with either failure to thrive or dehydration.  I am also concerned of the patient's oxygen saturation being around 91% at rest.  If he is getting hypoxic at times, this could contribute to his fatigue and altered mental status.  Now that family is able to provide the patient's baseline mental status and how the patient is nowhere near this at this time.  I do anticipate admission after work-up is completed.  We will get x-ray, labs, urinalysis, and will give the patient fluids.  The patient mucous membranes are very dry on exam with his decreased oral intake, suspect  dehydration.  Anticipate reassessment after work-up.  Patient was found to have hyponatremia with a sodium in the 160s.  He also had acute kidney injury.  Suspect is from the dehydration.  CK was slightly elevated over 400.  TSH normal.  X-ray did not show pneumonia.  Due to the altered mental status with hypernatremia and AKI, patient will be admitted for gentle rehydration and further management.  Hospitalist team will admit for further management.   Final Clinical Impression(s) / ED Diagnoses Final diagnoses:  COVID-19  Hypernatremia  AKI (acute kidney injury) ([place])  Dehydration     Clinical Impression: 1. COVID-19   2. Hypernatremia   3. AKI (acute kidney injury) ([place])   4. Dehydration     Disposition: Admit  This note was prepared with assistance of Dragon voice recognition software. Occasional wrong-word or sound-a-like substitutions may have occurred due to the inherent limitations of voice recognition software.     Toyoko Silos, Gwenyth Allegra, MD 10/25/19 [phone]

## 2019-10-25 NOTE — H&P (Signed)
History and Physical    Randy Chandler [id number] DOB: Nov 29, 1929 DOA: 10/25/2019  PCP: Terressa Koyanagi, MD   Patient coming from: [facility]  Chief Complaint: AMS, Poor Po Intake and Dehydration   HPI: Randy Chandler is a 85 y.o. male with medical history significant for but not limited too non-Hodgkin's B cell lymphoma, hypertension, hyperlipidemia, BPH, CAD, osteoarthritis, as well as other comorbidities who presents with chief complaint of altered mental status and significant dehydration.  Of note patient recently tested positive for COVID-19 disease at his facility.  Of note he is unable to provide a subjective history given his significant altered mental status and is significantly dehydrated.  I do spoke with the patient's daughters over the telephone and they also provided me some of the history.  For last few days he has had behavior changes per nursing facility and has been worsening since he had Covid.  Per patient's daughters he is normally talkative, ambulatory and goes to dancing classes and is very interactive but has had significant altered mental status with worsening fatigue and has not been eating or drinking and not been getting out of bed or cooperating staff.  He has not had any significant nausea or vomiting but per report there was some diarrhea.  He is found to be tachycardic and febrile 101.5 with soft blood pressures.  There is no complaint of any chest pain but patient is unable to provide a subjective history.  Further work-up showed severe dehydration and hyponatremia of 160 as well as an AKI.  Chest x-ray was clear and patient is not hypoxic.  [facility] was called to admit this patient for significant dehydration and failure to thrive in the setting of COVID-19 infection  ED Course: In the ED he was given normal saline bolus as well as having basic blood work done as well as an EKG and chest x-ray.    Review of Systems: As per HPI otherwise all other systems reviewed and  negative.   Past Medical History:  Diagnosis Date  . Arthritis   . BPH (benign prostatic hyperplasia)   . Coronary artery disease   . Osteoarthritis   -Hx of B Cell Non-Hodgkins Lymphoma -HTN -HLD  SURGICAL HISTORY Patient has had a colonoscopy as well as a thoracoscopy on 04/05/2017  SOCIAL HISTORY Hx of being a Social Drinker in the Past but no Tobacco Abuse   Allergies  Allergen Reactions  . Hytrin [Terazosin]    FAMILY HISTORY Patient's father had stomach cancer, and patient brother had hypertension as well as diabetes and dementia, patient's sister had cancer and dementia  Prior to Admission medications   Medication Sig Start Date End Date Taking? Authorizing Provider  acetaminophen (TYLENOL) 500 MG tablet Take 1,000 mg by mouth 2 (two) times daily.   Yes [provider]  amLODipine (NORVASC) 2.5 MG tablet Take 2.5 mg by mouth daily.   Yes [provider]  Cholecalciferol (VITAMIN D3) 125 MCG (5000 UT) CAPS Take 1 capsule by mouth daily.   Yes [provider]  dorzolamide-timolol (COSOPT) 22.3-6.8 MG/ML ophthalmic solution Place 1 drop into both eyes 2 (two) times daily.   Yes [provider]  latanoprost (XALATAN) 0.005 % ophthalmic solution Place 1 drop into both eyes at bedtime.   Yes [provider]  lidocaine (LIDODERM) 5 % Place 1 patch onto the skin daily. Remove & Discard patch within 12 hours or as directed by MD 07/13/19  Yes Carlisle Cater, PA-C  meloxicam (MOBIC) 7.5 MG  tablet Take 7.5 mg by mouth daily.   Yes [provider]  QUEtiapine (SEROQUEL) 25 MG tablet Take 12.5 mg by mouth daily.    Yes [provider]  QUEtiapine (SEROQUEL) 50 MG tablet Take 50 mg by mouth daily.   Yes [provider]  tamsulosin (FLOMAX) 0.4 MG CAPS capsule Take 0.4 mg by mouth daily after supper.   Yes [provider]  traMADol (ULTRAM) 50 MG tablet Take 1 tablet (50 mg total) by mouth every 12 (twelve)  hours as needed for severe pain (use for breakthrough pain if other medications are not helping). Patient taking differently: Take 50 mg by mouth 2 (two) times daily as needed for severe pain (use for breakthrough pain if other medications are not helping).  07/13/19  Yes Carlisle Cater, PA-C  traZODone (DESYREL) 50 MG tablet Take 50 mg by mouth at bedtime.   Yes [provider]  cephALEXin (KEFLEX) 500 MG capsule Take 1 capsule (500 mg total) by mouth 2 (two) times daily. Patient not taking: Reported on 10/25/2019 09/08/19   Orpah Greek, MD   Physical Exam: Vitals:   10/25/19 1800 10/25/19 1823 10/25/19 1851 10/25/19 1859  BP: (!) 147/97  (!) 163/95   Pulse: 97     Resp: (!) 24  (!) 21   Temp:  99.2 F (37.3 C)  97.7 F (36.5 C)  TempSrc:  Oral  Axillary  SpO2: 100%  92%   Weight:    46.3 kg  Height:    '5\' 6"'$  (1.676 m)   Constitutional: Thin elderly Chronically ill appearing AAM who has significantly dry mm Eyes: Lids normal and patient would not open eyes.   ENMT: External Ears, Nose appear normal. Grossly normal hearing. Mucous membranes are extremely dry  Neck: Appears normal, supple, no cervical masses, normal ROM, no appreciable thyromegaly Respiratory: Diminished to auscultation bilaterally, no wheezing, rales, rhonchi or crackles. Normal respiratory effort and patient is not tachypenic. No accessory muscle use. Unlabored breathing  Cardiovascular: RRR, no murmurs / rubs / gallops. S1 and S2 auscultated. No extremity edema. Abdomen: Soft, non-tender, non-distended. Bowel sounds positive x4.  GU: Deferred. Musculoskeletal: No clubbing / cyanosis of digits/nails. No joint deformity upper and lower extremities. Skin: No rashes, lesions, ulcers on a limited skin evaluation. No induration; Warm and dry.  Neurologic: CN 2-12 grossly intact with no focal deficits. Romberg sign and cerebellar reflexes not assessed.  Psychiatric: Impaired judgment and insight. Not Alert  and oriented x 3.   Labs on Admission: I have personally reviewed following labs and imaging studies  CBC: Recent Labs  Lab 10/25/19 1246  WBC 13.3*  NEUTROABS 11.2*  HGB 14.2  HCT 47.9  MCV 100.0  PLT 673   Basic Metabolic Panel: Recent Labs  Lab 10/25/19 1246 10/25/19 1658  NA 163* 162*  K 4.3 4.5  CL 119* 120*  CO2 29 30  GLUCOSE 128* 110*  BUN 64* 62*  CREATININE 1.75* 1.55*  CALCIUM 10.0 10.0   GFR: Estimated Creatinine Clearance: 22 mL/min (A) (by C-G formula based on SCr of 1.55 mg/dL (H)). Liver Function Tests: Recent Labs  Lab 10/25/19 1246  AST 41  ALT 33  ALKPHOS 61  BILITOT 0.7  PROT 9.7*  ALBUMIN 3.5   Recent Labs  Lab 10/25/19 1246  LIPASE 36   No results for input(s): AMMONIA in the last 168 hours. Coagulation Profile: No results for input(s): INR, PROTIME in the last 168 hours. Cardiac Enzymes: Recent Labs  Lab 10/25/19 1246  CKTOTAL 408*   BNP (last 3 results) No results for input(s): PROBNP in the last 8760 hours. HbA1C: No results for input(s): HGBA1C in the last 72 hours. CBG: No results for input(s): GLUCAP in the last 168 hours. Lipid Profile: No results for input(s): CHOL, HDL, LDLCALC, TRIG, CHOLHDL, LDLDIRECT in the last 72 hours. Thyroid Function Tests: Recent Labs    10/25/19 1246  TSH 1.850   Anemia Panel: Recent Labs    10/25/19 1658  FERRITIN 1,398*   Urine analysis:    Component Value Date/Time   COLORURINE YELLOW 10/25/2019 1500   APPEARANCEUR CLEAR 10/25/2019 1500   LABSPEC 1.025 10/25/2019 1500   PHURINE 5.0 10/25/2019 1500   GLUCOSEU NEGATIVE 10/25/2019 1500   HGBUR SMALL (A) 10/25/2019 1500   BILIRUBINUR NEGATIVE 10/25/2019 1500   KETONESUR NEGATIVE 10/25/2019 1500   PROTEINUR 100 (A) 10/25/2019 1500   NITRITE NEGATIVE 10/25/2019 1500   LEUKOCYTESUR NEGATIVE 10/25/2019 1500   Sepsis Labs: !!!!!!!!!!!!!!!!!!!!!!!!!!!!!!!!!!!!!!!!!!!! '@LABRCNTIP'$ (procalcitonin:4,lacticidven:4) )No results  found for this or any previous visit (from the past 240 hour(s)).   Radiological Exams on Admission: DG Chest Portable 1 View  Result Date: 10/25/2019 CLINICAL DATA:  COVID-19. EXAM: PORTABLE CHEST 1 VIEW COMPARISON:  None. FINDINGS: The heart size and mediastinal contours are within normal limits. Both lungs are clear. The visualized skeletal structures are unremarkable. IMPRESSION: No active disease. Electronically Signed   By: Marijo Conception M.D.   On: 10/25/2019 14:01   EKG: Independently reviewed. Showed Sinus Tachycardia at a rate of 107 with Atrial Premature Complexes  Assessment/Plan Active Problems:   Hyponatremia   Dehydration   Failure to thrive in adult   Generalized weakness   Acute metabolic encephalopathy   COVID-19 virus infection   AKI (acute kidney injury) ([place])  Significant Dehydration and associated Hyponatremia and Hypochloremia Failure to Thrive from COVID-19 Disease Generalized Weakness -Admit to stepdown unit Patient sodium went from 163 is now 162 Plan: #1 from 119 is now 120 -Patient's renal function is slightly improving -CK total of 408 -Patient did have a leukocytosis but he is on steroids and was significantly dehydrated -Given a liter of normal saline bolus in the ED -We will continue with D5W at 75 mL's per hour and check BMPs every 4 hours -Continue to monitor and trend and repeat CMP in a.m. -PT/OT to Evaluate -Will obtain Dietitian Evaluation   Acute metabolic encephalopathy in the setting of COVID-19 disease and significant dehydration -Continue IV fluids as above -Will obtain SLP Evaluation but in the interim will be NPO -UA Negative for Infection and CXR Negative   COVID-19 Disease -Tested Positive at [facility] and repeat here is Pending  -Inflammatory Marker Trend -Patient is not hypoxic and has no evidence of pneumonia on his chest x-ray however is significantly elevated inflammatory markers -Inflammatory Marker Trend: Recent  Labs    10/25/19 1658  DDIMER 18.29*  FERRITIN 1,398*  LDH 321*  CRP 21.0*  -LA went from 2.9 -> 2.1 -PCT was 0.19 -ESR was 112 -Fibrinogen was >800 No results found for: SARSCOV2NAA  -We will continue IV steroids with dexamethasone 6 was also transfused, some plasma -Patient is currently not hypoxic or having evidence of pneumonia so we will hold off on Remdesivir -We will however give convalescent plasma; may need some Actemra -Continue to monitor inflammatory marker trend -SpO2: 92 % -Continue with gentle IV fluid hydration as above and continue to monitor his respiratory status carefully -C/w COVID Glycemic Protocol  and has Novolog 0-9 units sq q4h -Will start Albuterol 2 puff IH q4hprn Wheezing and SOB  BPH -C/w Tamsuolisn 0.4 mg po daily if able to swallow  Glaucoma -C/w Dorzolamide-Timolol 22.3-6.8 mg/mL 1 drop both Eyes BID and Latanoprost 1 drop Both Eyes   AKI, improved slightly -Patient's BUN/Cr on Admission was 64/1.75 and improved to 62/1.55 -Given a 1 Liter IVF Bolus in the ED -Continue IVF Hydration with D5W -Hold Antihypertensives and Hold Naproxen  -Avoid nephrotoxic medications, contrast dyes, hypotension and renally adjust medications -Continue to monitor and trend renal function -Repeat CMP in a.m.  HTN -Hold Amlodipine  GOC: DNR, poA  DVT prophylaxis: Heparin 5,000 units sq q8h  Code Status: DO NOT RESUSCITATE  Family Communication: Spoke to patient's daughter Disposition Plan: Pending further workup and improvement  Consults called: None Admission status: Inpatient SDU   Severity of Illness: The appropriate patient status for this patient is INPATIENT. Inpatient status is judged to be reasonable and necessary in order to provide the required intensity of service to ensure the patient's safety. The patient's presenting symptoms, physical exam findings, and initial radiographic and laboratory data in the context of their chronic comorbidities is  felt to place them at high risk for further clinical deterioration. Furthermore, it is not anticipated that the patient will be medically stable for discharge from the hospital within 2 midnights of admission. The following factors support the patient status of inpatient.   " The patient's presenting symptoms include altered mental status significant from baseline. " The worrisome physical exam findings include significantly dry mucous membranes. " The initial radiographic and laboratory data are worrisome because of worsened hyponatremia.  " The chronic co-morbidities are listed as above  * I certify that at the point of admission it is my clinical judgment that the patient will require inpatient hospital care spanning beyond 2 midnights from the point of admission due to high intensity of service, high risk for further deterioration and high frequency of surveillance required.Kerney Elbe, D.O. [facility] PAG[facility] is on [place]  If 7PM-7AM, please contact night-coverage [email or web address]  10/25/2019, 8:12 PM

## 2019-10-25 NOTE — ED Notes (Signed)
Attempted to call report, no answer, will try back in 5 min.

## 2019-10-25 NOTE — ED Triage Notes (Signed)
Per EMS, pt from [facility] sent for not eating or drinking/altered mental status. Pt tested positive for COVID at nursing facility. Pt has hx of dementia but staff state he is not at baseline. His family requested he be evaluated at hospital and felt he needs IV fluids. He is not cooperating with staff, which he usually does.  BP 104/76 Temp 101.5 RR 20 HR 100

## 2019-10-25 NOTE — ED Notes (Signed)
Attempted to call report for second time, no answer. Secretary called, RN in contact room, left message to call when RN is out of room.

## 2019-10-25 NOTE — ED Notes (Signed)
ED TO INPATIENT HANDOFF REPORT  Name/Age/Gender Randy Chandler 85 y.o. male  Code Status   Home/SNF/Other Skilled nursing facility  Chief Complaint Hyponatremia [E87.1] Dehydration [E86.0]  Level of Care/Admitting Diagnosis ED Disposition    ED Disposition Condition [facility]: [place] [[id number]]  Level of Care: Stepdown [14]  Admit to SDU based on following criteria: Hemodynamic compromise or significant risk of instability:  Patient requiring short term acute titration and management of vasoactive drips, and invasive monitoring (i.e., CVP and Arterial line).  Covid Evaluation: Confirmed COVID Positive  Diagnosis: Dehydration [276.51.ICD-9-CM]  Admitting Physician: [place], [place] [id number]  Attending Physician: Raiford Noble LATIF [id number]  Estimated length of stay: past midnight tomorrow  Certification:: I certify this patient will need inpatient services for at least 2 midnights       Medical History Past Medical History:  Diagnosis Date  . Arthritis   . BPH (benign prostatic hyperplasia)   . Coronary artery disease   . Osteoarthritis     Allergies Allergies  Allergen Reactions  . Hytrin [Terazosin]     IV Location/Drains/Wounds Patient Lines/Drains/Airways Status   Active Line/Drains/Airways    Name:   Placement date:   Placement time:   Site:   Days:   Peripheral IV 10/25/19 Left Forearm   10/25/19    1716    Forearm   less than 1          Labs/Imaging Results for orders placed or performed during the hospital encounter of 10/25/19 (from the past 48 hour(s))  CBC with Differential     Status: Abnormal   Collection Time: 10/25/19 12:46 PM  Result Value Ref Range   WBC 13.3 (H) 4.0 - 10.5 K/uL   RBC 4.79 4.22 - 5.81 MIL/uL   Hemoglobin 14.2 13.0 - 17.0 g/dL   HCT 47.9 39.0 - 52.0 %   MCV 100.0 80.0 - 100.0 fL   MCH 29.6 26.0 - 34.0 pg   MCHC 29.6 (L) 30.0 - 36.0 g/dL   RDW 14.1 11.5 - 15.5 %   Platelets 351 150 - 400 K/uL   nRBC 0.0 0.0 - 0.2 %   Neutrophils Relative % 85 %   Neutro Abs 11.2 (H) 1.7 - 7.7 K/uL   Lymphocytes Relative 8 %   Lymphs Abs 1.1 0.7 - 4.0 K/uL   Monocytes Relative 6 %   Monocytes Absolute 0.8 0.1 - 1.0 K/uL   Eosinophils Relative 0 %   Eosinophils Absolute 0.0 0.0 - 0.5 K/uL   Basophils Relative 0 %   Basophils Absolute 0.0 0.0 - 0.1 K/uL   Immature Granulocytes 1 %   Abs Immature Granulocytes 0.11 (H) 0.00 - 0.07 K/uL    Comment: Performed at [facility], [place] [address] [place], [place] [address]  Comprehensive metabolic panel     Status: Abnormal   Collection Time: 10/25/19 12:46 PM  Result Value Ref Range   Sodium 163 (HH) 135 - 145 mmol/L    Comment: Marcy Panning RN 1349 10/25/19 JM   Potassium 4.3 3.5 - 5.1 mmol/L   Chloride 119 (H) 98 - 111 mmol/L   CO2 29 22 - 32 mmol/L   Glucose, Bld 128 (H) 70 - 99 mg/dL   BUN 64 (H) 8 - 23 mg/dL   Creatinine, Ser 1.75 (H) 0.61 - 1.24 mg/dL   Calcium 10.0 8.9 - 10.3 mg/dL   Total Protein 9.7 (H) 6.5 - 8.1 g/dL   Albumin  3.5 3.5 - 5.0 g/dL   AST 41 15 - 41 U/L   ALT 33 0 - 44 U/L   Alkaline Phosphatase 61 38 - 126 U/L   Total Bilirubin 0.7 0.3 - 1.2 mg/dL   GFR calc non Af Amer 34 (L) >60 mL/min   GFR calc Af Amer 40 (L) >60 mL/min   Anion gap 15 5 - 15    Comment: Performed at [facility], [place] [address], [place], [place] [address]  Lipase, blood     Status: None   Collection Time: 10/25/19 12:46 PM  Result Value Ref Range   Lipase 36 11 - 51 U/L    Comment: Performed at [facility], [place] [address] [place], [place] [address]  Lactic acid, plasma     Status: Abnormal   Collection Time: 10/25/19 12:46 PM  Result Value Ref Range   Lactic Acid, Venous 2.9 (HH) 0.5 - 1.9 mmol/L    Comment: [place] RN 1350 10/25/19 JM Performed at [facility], [place] [address], [place], [place] [address]   CK     Status: Abnormal    Collection Time: 10/25/19 12:46 PM  Result Value Ref Range   Total CK 408 (H) 49 - 397 U/L    Comment: Performed at [facility], [place] [address], [place], [place] [address]  TSH     Status: None   Collection Time: 10/25/19 12:46 PM  Result Value Ref Range   TSH 1.850 0.350 - 4.500 uIU/mL    Comment: Performed by a 3rd Generation assay with a functional sensitivity of <=0.01 uIU/mL. Performed at [facility], [place] [address] [place], [place] [address]   Urinalysis, Routine w reflex microscopic     Status: Abnormal   Collection Time: 10/25/19  3:00 PM  Result Value Ref Range   Color, Urine YELLOW YELLOW   APPearance CLEAR CLEAR   Specific Gravity, Urine 1.025 1.005 - 1.030   pH 5.0 5.0 - 8.0   Glucose, UA NEGATIVE NEGATIVE mg/dL   Hgb urine dipstick SMALL (A) NEGATIVE   Bilirubin Urine NEGATIVE NEGATIVE   Ketones, ur NEGATIVE NEGATIVE mg/dL   Protein, ur 100 (A) NEGATIVE mg/dL   Nitrite NEGATIVE NEGATIVE   Leukocytes,Ua NEGATIVE NEGATIVE   RBC / HPF 0-5 0 - 5 RBC/hpf   WBC, UA 0-5 0 - 5 WBC/hpf   Bacteria, UA NONE SEEN NONE SEEN   Squamous Epithelial / LPF 0-5 0 - 5   Mucus PRESENT    Hyaline Casts, UA PRESENT    Granular Casts, UA PRESENT     Comment: Performed at [facility], [place] [address], [place], [place] [address]   DG Chest Portable 1 View  Result Date: 10/25/2019 CLINICAL DATA:  COVID-19. EXAM: PORTABLE CHEST 1 VIEW COMPARISON:  None. FINDINGS: The heart size and mediastinal contours are within normal limits. Both lungs are clear. The visualized skeletal structures are unremarkable. IMPRESSION: No active disease. Electronically Signed   By: Marijo Conception M.D.   On: 10/25/2019 14:01    Pending Labs Unresulted Labs (From admission, onward)    Start     Ordered   10/26/19 0500  Procalcitonin  Daily,   R     10/25/19 1532   10/25/19 1535  D-dimer, quantitative (not at [facility])  Once,   STAT     10/25/19 1534    10/25/19 1535  Sedimentation rate  Once,   STAT  10/25/19 1534   10/25/19 1535  C-reactive protein  Once,   STAT     10/25/19 1534   10/25/19 1535  Fibrinogen  Once,   STAT     10/25/19 1534   10/25/19 1535  Ferritin  Once,   STAT     10/25/19 1534   10/25/19 1534  Lactate dehydrogenase  Once,   STAT     10/25/19 1534   10/25/19 [id number]  Basic metabolic panel  Now then every 4 hours,   R (with STAT occurrences)     10/25/19 1532   10/25/19 1533  Procalcitonin - Baseline  ONCE - STAT,   STAT     10/25/19 1532   10/25/19 1532  SARS CORONAVIRUS 2 (TAT 6-24 HRS) Nasopharyngeal Nasopharyngeal Swab  (Tier 3 (TAT 6-24 hrs))  Once,   STAT    Question Answer Comment  Is this test for diagnosis or screening Screening   Symptomatic for COVID-19 as defined by CDC No   Hospitalized for COVID-19 No   Admitted to ICU for COVID-19 No   Previously tested for COVID-19 No   Resident in a congregate (group) care setting Unknown   Employed in healthcare setting Unknown      10/25/19 1531   10/25/19 1232  Urine culture  ONCE - STAT,   STAT     10/25/19 1232   10/25/19 1231  Lactic acid, plasma  Now then every 2 hours,   STAT     10/25/19 1232   Signed and Held  ABO/Rh  Once,   R     Signed and Held   Signed and Held  TSH  Tomorrow morning,   R     Signed and Held   Signed and Held  Comprehensive metabolic panel  Tomorrow morning,   R     Signed and Held   Visual merchandiser and Held  CBC  Tomorrow morning,   R     Signed and Held   Visual merchandiser and Held  CBC with Differential/Platelet  Daily,   R     Signed and Held   Visual merchandiser and Held  Hepatitis B surface antigen  Add-on,   R     Signed and Held   Signed and Held  Comprehensive metabolic panel  Daily,   R     Signed and Held   Signed and Held  C-reactive protein  Daily,   R     Signed and Held   Signed and Held  D-dimer, quantitative (not at [facility])  Daily,   R     Signed and Held   Signed and Held  Ferritin  Daily,   R     Signed and Held   Signed and Held   Magnesium  Daily,   R     Signed and Held   Signed and Held  Phosphorus  Daily,   R     Signed and Held          Vitals/Pain Today's Vitals   10/25/19 1655 10/25/19 1700 10/25/19 1730 10/25/19 1800  BP: 115/84 129/89 (!) 146/99 (!) 147/97  Pulse: (!) 102 (!) 101 96 97  Resp: 16 20 20  (!) 24  Temp:      TempSrc:      SpO2: 97% 96% 100% 100%    Isolation Precautions No active isolations  Medications Medications  dextrose 5 % with KCl 20 mEq / L  infusion (has no administration in time range)  sodium chloride 0.9 %  bolus 1,000 mL (0 mLs Intravenous Stopped 10/25/19 1738)    Mobility non-ambulatory

## 2019-10-26 ENCOUNTER — Encounter (HOSPITAL_COMMUNITY): Payer: Self-pay | Admitting: Internal Medicine

## 2019-10-26 ENCOUNTER — Inpatient Hospital Stay (HOSPITAL_COMMUNITY): Payer: Medicare Other

## 2019-10-26 ENCOUNTER — Other Ambulatory Visit: Payer: Self-pay

## 2019-10-26 DIAGNOSIS — G9341 Metabolic encephalopathy: Secondary | ICD-10-CM

## 2019-10-26 LAB — BASIC METABOLIC PANEL
Anion gap: 10 (ref 5–15)
Anion gap: 10 (ref 5–15)
BUN: 46 mg/dL — ABNORMAL HIGH (ref 8–23)
BUN: 44 mg/dL — ABNORMAL HIGH (ref 8–23)
CO2: 26 mmol/L (ref 22–32)
CO2: 28 mmol/L (ref 22–32)
Calcium: 9.5 mg/dL (ref 8.9–10.3)
Calcium: 9.6 mg/dL (ref 8.9–10.3)
Chloride: 124 mmol/L — ABNORMAL HIGH (ref 98–111)
Chloride: 121 mmol/L — ABNORMAL HIGH (ref 98–111)
Creatinine, Ser: 1.34 mg/dL — ABNORMAL HIGH (ref 0.61–1.24)
Creatinine, Ser: 1.26 mg/dL — ABNORMAL HIGH (ref 0.61–1.24)
GFR calc Af Amer: 55 mL/min — ABNORMAL LOW (ref >60)
GFR calc Af Amer: 59 mL/min — ABNORMAL LOW (ref >60)
GFR calc non Af Amer: 47 mL/min — ABNORMAL LOW (ref >60)
GFR calc non Af Amer: 51 mL/min — ABNORMAL LOW (ref >60)
Glucose, Bld: 129 mg/dL — ABNORMAL HIGH (ref 70–99)
Glucose, Bld: 166 mg/dL — ABNORMAL HIGH (ref 70–99)
Potassium: 4.5 mmol/L (ref 3.5–5.1)
Potassium: 4.3 mmol/L (ref 3.5–5.1)
Sodium: 160 mmol/L — ABNORMAL HIGH (ref 135–145)
Sodium: 159 mmol/L — ABNORMAL HIGH (ref 135–145)

## 2019-10-26 LAB — COMPREHENSIVE METABOLIC PANEL
ALT: 33 U/L (ref 0–44)
ALT: 29 U/L (ref 0–44)
AST: 41 U/L (ref 15–41)
AST: 38 U/L (ref 15–41)
Albumin: 3.5 g/dL (ref 3.5–5.0)
Albumin: 3.3 g/dL — ABNORMAL LOW (ref 3.5–5.0)
Alkaline Phosphatase: 61 U/L (ref 38–126)
Alkaline Phosphatase: 55 U/L (ref 38–126)
Anion gap: 15 (ref 5–15)
Anion gap: 10 (ref 5–15)
BUN: 64 mg/dL — ABNORMAL HIGH (ref 8–23)
BUN: 48 mg/dL — ABNORMAL HIGH (ref 8–23)
CO2: 29 mmol/L (ref 22–32)
CO2: 27 mmol/L (ref 22–32)
Calcium: 10 mg/dL (ref 8.9–10.3)
Calcium: 9.6 mg/dL (ref 8.9–10.3)
Chloride: 119 mmol/L — ABNORMAL HIGH (ref 98–111)
Chloride: 125 mmol/L — ABNORMAL HIGH (ref 98–111)
Creatinine, Ser: 1.75 mg/dL — ABNORMAL HIGH (ref 0.61–1.24)
Creatinine, Ser: 1.27 mg/dL — ABNORMAL HIGH (ref 0.61–1.24)
GFR calc Af Amer: 40 mL/min — ABNORMAL LOW (ref >60)
GFR calc Af Amer: 58 mL/min — ABNORMAL LOW (ref >60)
GFR calc non Af Amer: 34 mL/min — ABNORMAL LOW (ref >60)
GFR calc non Af Amer: 50 mL/min — ABNORMAL LOW (ref >60)
Glucose, Bld: 128 mg/dL — ABNORMAL HIGH (ref 70–99)
Glucose, Bld: 162 mg/dL — ABNORMAL HIGH (ref 70–99)
Potassium: 4.3 mmol/L (ref 3.5–5.1)
Potassium: 4.9 mmol/L (ref 3.5–5.1)
Sodium: 163 mmol/L (ref 135–145)
Sodium: 162 mmol/L (ref 135–145)
Total Bilirubin: 0.7 mg/dL (ref 0.3–1.2)
Total Bilirubin: 0.4 mg/dL (ref 0.3–1.2)
Total Protein: 9.7 g/dL — ABNORMAL HIGH (ref 6.5–8.1)
Total Protein: 8.5 g/dL — ABNORMAL HIGH (ref 6.5–8.1)

## 2019-10-26 LAB — GLUCOSE, CAPILLARY
Glucose-Capillary: 108 mg/dL — ABNORMAL HIGH (ref 70–99)
Glucose-Capillary: 125 mg/dL — ABNORMAL HIGH (ref 70–99)
Glucose-Capillary: 146 mg/dL — ABNORMAL HIGH (ref 70–99)
Glucose-Capillary: 157 mg/dL — ABNORMAL HIGH (ref 70–99)
Glucose-Capillary: 99 mg/dL (ref 70–99)

## 2019-10-26 LAB — CBC
HCT: 42.2 % (ref 39.0–52.0)
Hemoglobin: 12.7 g/dL — ABNORMAL LOW (ref 13.0–17.0)
MCH: 30 pg (ref 26.0–34.0)
MCHC: 30.1 g/dL (ref 30.0–36.0)
MCV: 99.5 fL (ref 80.0–100.0)
Platelets: 244 10*3/uL (ref 150–400)
RBC: 4.24 MIL/uL (ref 4.22–5.81)
RDW: 13.9 % (ref 11.5–15.5)
WBC: 11.3 10*3/uL — ABNORMAL HIGH (ref 4.0–10.5)
nRBC: 0 % (ref 0.0–0.2)

## 2019-10-26 LAB — MAGNESIUM: Magnesium: 2.8 mg/dL — ABNORMAL HIGH (ref 1.7–2.4)

## 2019-10-26 LAB — TSH: TSH: 1.157 u[IU]/mL (ref 0.350–4.500)

## 2019-10-26 LAB — CBC WITH DIFFERENTIAL/PLATELET
Abs Immature Granulocytes: 0.11 10*3/uL — ABNORMAL HIGH (ref 0.00–0.07)
Basophils Absolute: 0 10*3/uL (ref 0.0–0.1)
Basophils Relative: 0 %
Eosinophils Absolute: 0.1 10*3/uL (ref 0.0–0.5)
Eosinophils Relative: 1 %
HCT: 43.4 % (ref 39.0–52.0)
Hemoglobin: 12.6 g/dL — ABNORMAL LOW (ref 13.0–17.0)
Immature Granulocytes: 1 %
Lymphocytes Relative: 3 %
Lymphs Abs: 0.4 10*3/uL — ABNORMAL LOW (ref 0.7–4.0)
MCH: 29.6 pg (ref 26.0–34.0)
MCHC: 29 g/dL — ABNORMAL LOW (ref 30.0–36.0)
MCV: 102.1 fL — ABNORMAL HIGH (ref 80.0–100.0)
Monocytes Absolute: 0.4 10*3/uL (ref 0.1–1.0)
Monocytes Relative: 3 %
Neutro Abs: 9.4 10*3/uL — ABNORMAL HIGH (ref 1.7–7.7)
Neutrophils Relative %: 92 %
Platelets: 255 10*3/uL (ref 150–400)
RBC: 4.25 MIL/uL (ref 4.22–5.81)
RDW: 14.2 % (ref 11.5–15.5)
WBC: 10.3 10*3/uL (ref 4.0–10.5)
nRBC: 0 % (ref 0.0–0.2)

## 2019-10-26 LAB — LACTIC ACID, PLASMA: Lactic Acid, Venous: 2.9 mmol/L (ref 0.5–1.9)

## 2019-10-26 LAB — PROCALCITONIN: Procalcitonin: 0.1 ng/mL

## 2019-10-26 LAB — SARS CORONAVIRUS 2 (TAT 6-24 HRS): SARS Coronavirus 2: POSITIVE — AB

## 2019-10-26 LAB — URINE CULTURE: Culture: NO GROWTH

## 2019-10-26 LAB — TROPONIN I (HIGH SENSITIVITY): Troponin I (High Sensitivity): 15 ng/L (ref <18)

## 2019-10-26 LAB — ABO/RH: ABO/RH(D): O POS

## 2019-10-26 LAB — D-DIMER, QUANTITATIVE: D-Dimer, Quant: 11.02 ug/mL-FEU — ABNORMAL HIGH (ref 0.00–0.50)

## 2019-10-26 LAB — FERRITIN: Ferritin: 944 ng/mL — ABNORMAL HIGH (ref 24–336)

## 2019-10-26 LAB — C-REACTIVE PROTEIN: CRP: 18 mg/dL — ABNORMAL HIGH (ref <1.0)

## 2019-10-26 LAB — PHOSPHORUS: Phosphorus: 3.2 mg/dL (ref 2.5–4.6)

## 2019-10-26 MED ORDER — CHLORHEXIDINE GLUCONATE CLOTH 2 % EX PADS
6.0000 | MEDICATED_PAD | Freq: Every day | CUTANEOUS | Status: DC
  Administered 2019-10-26 – 2019-11-05 (×9): 6 via TOPICAL

## 2019-10-26 MED ORDER — LABETALOL HCL 5 MG/ML IV SOLN
10.0000 mg | INTRAVENOUS | Status: DC | PRN
  Filled 2019-10-26 (×2): qty 4

## 2019-10-26 MED ORDER — FREE WATER: 400.0000 mL | Freq: Three times a day (TID) | Status: DC

## 2019-10-26 MED ORDER — LORAZEPAM 2 MG/ML IJ SOLN
0.5000 mg | Freq: Once | INTRAMUSCULAR | Status: AC
  Administered 2019-10-26: 0.5 mg via INTRAVENOUS
  Filled 2019-10-26: qty 1

## 2019-10-26 MED ORDER — ORAL CARE MOUTH RINSE
15.0000 mL | Freq: Two times a day (BID) | OROMUCOSAL | Status: DC
  Administered 2019-10-26 – 2019-11-05 (×18): 15 mL via OROMUCOSAL

## 2019-10-26 NOTE — Progress Notes (Signed)
Pt will not allow me to visualize his eyes at this time

## 2019-10-26 NOTE — Progress Notes (Signed)
Fresh frozen plasma transfusion complete

## 2019-10-26 NOTE — Progress Notes (Signed)
PROGRESS NOTE    Randy Chandler  [id number] DOB: 08-Feb-1930 DOA: 10/25/2019 PCP: Terressa Koyanagi, MD   Brief Narrative:  [place] patient is 85 year old male with history of non-Hodgkin's B-cell lymphoma, hypertension, hyperlipidemia, BPH, coronary disease, also arthritis who presented with altered mental status, dehydration.  He was recently tested positive for Covid does not disease at [facility] nursing facility.  Patient was unable to provide any information due to significant altered mental status on presentation.  Patient has  history of dementia in the past and is confused at baseline and but pretty functional as per the family's report.  On presentation he was found to the ED, febrile.  His sodium was found to be 160, he had severe AKI and dehydration.  Chest x-ray showed pneumonia.  Patient was not hypoxic and was saturating fine on room air.  Patient admitted for the management of severe hypernatremia, AKI and dehydration.  Assessment & Plan:   Active Problems:   Hyponatremia   Dehydration   Failure to thrive in adult   Generalized weakness   Acute metabolic encephalopathy   COVID-19 virus infection   AKI (acute kidney injury) (HCC)   Significant dehydration secondary to poor oral intake, failure to thrive from Covid-19 disease: Continue IV fluids.  Currently hemodynamically stable.  Patient not able to take anything orally due to altered mental status.  Acute metabolic encephalopathy due to hypernatremia/Covid-19 disease: History of dementia,confused at baseline.Continue to monitor mental status.  High risk for aspiration.  Speech therapy following.  Severe hypernatremia: Secondary to dehydration.  Continue to monitor BMP every 4 hours.  Continue hypotonic fluid.  Continue free water .we will put NG tube  Covid -19 disease: Test was positive at nursing facility.  Confirmed here.  Not hypoxic.  Chest x-ray did not show pneumonia.  Not requiring oxygen.  Chest x-ray done  this morning showed possible small pneumothorax on the right side.  No need of further intervention for this. Continue to monitor inflammatory markers.  Significantly  elevated ferritin, CRP and D-dimer. Started on dexamethasone.  Not started on remdesivir due to lack of hypoxia.  B cell lymphoma: Currently on remission  AKI: Secondary to dehydration, poor oral intake.  Continue IV fluids.  Kidney function improving  BPH: Continue tamsulosin  Glaucoma: Continue home eyedrops  Hypertension: Antihypertensives on hold.  Blood pressure stable  Debility/deconditioning/advanced [id number] is DNR.  Memory care  facility resident.             DVT prophylaxis: Heparin [place] Code Status: Full Family Communication: Called daughter on phone Disposition Plan: Back to facility when improvement in the mental status, sodium level   Consultants: None  Procedures: None  Antimicrobials:  Anti-infectives (From admission, onward)   None      Subjective: Patient seen and examined the bedside this morning.  Currently hemodynamically stable.  Saturating fine on room air.  He was not in any kind of distress.  But he is very confused, not alert.  Need to put NG tube for free water.  Objective: Vitals:   10/26/19 0800 10/26/19 0900 10/26/19 1000 10/26/19 1100  BP: 128/68     Pulse: 92     Resp: 13 (!) 21 14 13   Temp: 97.8 F (36.6 C)     TempSrc: Oral     SpO2: 100%     Weight:      Height:        Intake/Output Summary (Last 24 hours) at 10/26/2019 1439 Last data filed at 10/26/2019  1000 Gross per 24 hour  Intake 1426 ml  Output 400 ml  Net 1026 ml   Filed Weights   10/25/19 1859 10/26/19 0600  Weight: 46.3 kg 46.6 kg    Examination:  General exam: Deconditioned, debilitated, elderly, frail HEENT: Ear/Nose normal on gross exam Respiratory system: Bilateral equal air entry, normal vesicular breath sounds, no wheezes or crackles  Cardiovascular system: S1 & S2 heard, RRR.  No JVD, murmurs, rubs, gallops or clicks. No pedal edema. Gastrointestinal system: Abdomen is nondistended, soft and nontender. No organomegaly or masses felt. Normal bowel sounds heard. Central nervous system: Not alert or oriented  extremities: No edema, no clubbing ,no cyanosis, distal peripheral pulses palpable. Skin: No rashes, lesions or ulcers,no icterus ,no pallor    Data Reviewed: I have personally reviewed following labs and imaging studies  CBC: Recent Labs  Lab 10/25/19 1246 10/26/19 0443  WBC 13.3* 10.3  NEUTROABS 11.2* 9.4*  HGB 14.2 12.6*  HCT 47.9 43.4  MCV 100.0 102.1*  PLT 351 [id number]   Basic Metabolic Panel: Recent Labs  Lab 10/25/19 1658 10/25/19 2056 10/26/19 0443 10/26/19 0741 10/26/19 1225  NA 162* 164* 162* 160* 159*  K 4.5 4.8 4.9 4.5 4.3  CL 120* 126* 125* 124* 121*  CO2 30 25 27 26 28   GLUCOSE 110* 111* 162* 129* 166*  BUN 62* 52* 48* 46* 44*  CREATININE 1.55* 1.48* 1.27* 1.34* 1.26*  CALCIUM 10.0 9.7 9.6 9.5 9.6  MG  --   --  2.8*  --   --   PHOS  --   --  3.2  --   --    GFR: Estimated Creatinine Clearance: 27.2 mL/min (A) (by C-G formula based on SCr of 1.26 mg/dL (H)). Liver Function Tests: Recent Labs  Lab 10/25/19 1246 10/26/19 0443  AST 41 38  ALT 33 29  ALKPHOS 61 55  BILITOT 0.7 0.4  PROT 9.7* 8.5*  ALBUMIN 3.5 3.3*   Recent Labs  Lab 10/25/19 1246  LIPASE 36   No results for input(s): AMMONIA in the last 168 hours. Coagulation Profile: No results for input(s): INR, PROTIME in the last 168 hours. Cardiac Enzymes: Recent Labs  Lab 10/25/19 1246  CKTOTAL 408*   BNP (last 3 results) No results for input(s): PROBNP in the last 8760 hours. HbA1C: No results for input(s): HGBA1C in the last 72 hours. CBG: Recent Labs  Lab 10/25/19 2039 10/25/19 2334 10/26/19 0348 10/26/19 0850 10/26/19 1125  GLUCAP 91 116* 157* 108* 125*   Lipid Profile: No results for input(s): CHOL, HDL, LDLCALC, TRIG, CHOLHDL, LDLDIRECT  in the last 72 hours. Thyroid Function Tests: Recent Labs    10/26/19 0443  TSH 1.157   Anemia Panel: Recent Labs    10/25/19 1658 10/26/19 0443  FERRITIN 1,398* 944*   Sepsis Labs: Recent Labs  Lab 10/25/19 1246 10/25/19 1658 10/25/19 1740 10/26/19 0443  PROCALCITON  --  0.19  --  0.10  LATICACIDVEN 2.9*  --  2.1*  --     Recent Results (from the past 240 hour(s))  SARS CORONAVIRUS 2 (TAT 6-24 HRS) Nasopharyngeal Nasopharyngeal Swab     Status: Abnormal   Collection Time: 10/25/19  4:58 PM   Specimen: Nasopharyngeal Swab  Result Value Ref Range Status   SARS Coronavirus 2 POSITIVE (A) NEGATIVE Final    Comment: RESULT CALLED TO, READ BACK BY AND VERIFIED WITH: MBland Span 0110 10/26/2019 T. TYSOR (NOTE) SARS-CoV-2 target nucleic acids are DETECTED. The SARS-CoV-2 RNA  is generally detectable in upper and lower respiratory specimens during the acute phase of infection. Positive results are indicative of the presence of SARS-CoV-2 RNA. Clinical correlation with patient history and other diagnostic information is  necessary to determine patient infection status. Positive results do not rule out bacterial infection or co-infection with other viruses.  The expected result is Negative. Fact Sheet for Patients: SugarRoll.be Fact Sheet for Healthcare Providers: https://www.woods-mathews.com/ This test is not yet approved or cleared by the [place] FDA and  has been authorized for detection and/or diagnosis of SARS-CoV-2 by FDA under an Emergency Use Authorization (EUA). This EUA will remain  in effect (meaning this test can be used) for t he duration of the COVID-19 declaration under Section 564(b)(1) of the Act, 21 U.S.C. section 360bbb-3(b)(1), unless the authorization is terminated or revoked sooner. Performed at [facility], [place] [address], [place], [place] [address]   MRSA PCR Screening     Status: None    Collection Time: 10/25/19  7:08 PM   Specimen: Nasal Mucosa; Nasopharyngeal  Result Value Ref Range Status   MRSA by PCR NEGATIVE NEGATIVE Final    Comment:        The GeneXpert MRSA Assay (FDA approved for NASAL specimens only), is one component of a comprehensive MRSA colonization surveillance program. It is not intended to diagnose MRSA infection nor to guide or monitor treatment for MRSA infections. Performed at [facility], [place] [address], [place], [place] [address]          Radiology Studies: DG Chest Port 1 View  Result Date: 10/26/2019 CLINICAL DATA:  Shortness of breath EXAM: PORTABLE CHEST 1 VIEW COMPARISON:  10/25/2019 FINDINGS: The heart size and mediastinal contours are within normal limits. Calcific aortic knob. No focal airspace consolidation. No pleural effusion. There is a thin curvilinear line at the lateral aspect of the right hemithorax which may reflect a skin fold versus a small pneumothorax. This was not seen on the previous exam. The visualized skeletal structures are unremarkable. IMPRESSION: Possible small (<10%) right-sided pneumothorax. Repeat upright expiratory PA chest radiograph could be performed to confirm. These results will be called to the ordering clinician or representative by the Radiologist Assistant, and communication documented in the PACS or zVision Dashboard. Electronically Signed   By: Davina Poke M.D.   On: 10/26/2019 11:38   DG Chest Portable 1 View  Result Date: 10/25/2019 CLINICAL DATA:  COVID-19. EXAM: PORTABLE CHEST 1 VIEW COMPARISON:  None. FINDINGS: The heart size and mediastinal contours are within normal limits. Both lungs are clear. The visualized skeletal structures are unremarkable. IMPRESSION: No active disease. Electronically Signed   By: Marijo Conception M.D.   On: 10/25/2019 14:01        Scheduled Meds: . Chlorhexidine Gluconate Cloth  6 each Topical Daily  . cholecalciferol  5,000 Units Oral  Daily  . dexamethasone (DECADRON) injection  6 mg Intravenous Q24H  . dorzolamide-timolol  1 drop Both Eyes BID  . free water  400 mL Per Tube Q8H  . heparin  7,500 Units Subcutaneous Q8H  . insulin aspart  0-9 Units Subcutaneous Q4H  . latanoprost  1 drop Both Eyes QHS  . LORazepam  0.5 mg Intravenous Once  . mouth rinse  15 mL Mouth Rinse BID  . QUEtiapine  12.5 mg Oral Daily  . QUEtiapine  50 mg Oral Daily  . tamsulosin  0.4 mg Oral QPC supper  . traZODone  50 mg Oral  QHS   Continuous Infusions: . dextrose 5 % with KCl 20 mEq / L 20 mEq (10/26/19 1118)     LOS: 1 day    Time spent: More than 50% of that time was spent in counseling and/or coordination of care.      Shelly Coss, MD [facility] Pager [phone]  If 7PM-7AM, please contact night-coverage [email or web address] Password [email or web address] 10/26/2019, 2:39 PM

## 2019-10-26 NOTE — Progress Notes (Signed)
Lab notified me of critical sodium result. Text paged on call X. Randy Chandler

## 2019-10-26 NOTE — Progress Notes (Addendum)
SLP Cancellation Note  Patient Details Name: Randy Chandler MRN: [id number] DOB: 03-Jun-1930   Cancelled treatment:       Reason Eval/Treat Not Completed: Patient at procedure or test/unavailable. (AM) Pt currently undergoing testing in room. Will continue efforts to evaluate and determine readiness for po intake.  (PM) Consulted with RN regarding appropriateness of proceeding with BSE. RN recommends deferring evaluation today. Will follow up with pt next date, possibly after transfer to [facility]   Carmela Rima, [place] Speech Language Pathologist Office: [phone] Pager: [phone]  Shonna Chock 10/26/2019, 9:33 AM  10/26/2019, 13:41 PM

## 2019-10-27 DIAGNOSIS — E870 Hyperosmolality and hypernatremia: Secondary | ICD-10-CM

## 2019-10-27 LAB — GLUCOSE, CAPILLARY
Glucose-Capillary: 150 mg/dL — ABNORMAL HIGH (ref 70–99)
Glucose-Capillary: 109 mg/dL — ABNORMAL HIGH (ref 70–99)
Glucose-Capillary: 101 mg/dL — ABNORMAL HIGH (ref 70–99)
Glucose-Capillary: 89 mg/dL (ref 70–99)
Glucose-Capillary: 125 mg/dL — ABNORMAL HIGH (ref 70–99)
Glucose-Capillary: 88 mg/dL (ref 70–99)

## 2019-10-27 LAB — CBC WITH DIFFERENTIAL/PLATELET
Abs Immature Granulocytes: 0.07 10*3/uL (ref 0.00–0.07)
Basophils Absolute: 0 10*3/uL (ref 0.0–0.1)
Basophils Relative: 0 %
Eosinophils Absolute: 0 10*3/uL (ref 0.0–0.5)
Eosinophils Relative: 0 %
HCT: 43.4 % (ref 39.0–52.0)
Hemoglobin: 12.8 g/dL — ABNORMAL LOW (ref 13.0–17.0)
Immature Granulocytes: 1 %
Lymphocytes Relative: 6 %
Lymphs Abs: 0.6 10*3/uL — ABNORMAL LOW (ref 0.7–4.0)
MCH: 29.4 pg (ref 26.0–34.0)
MCHC: 29.5 g/dL — ABNORMAL LOW (ref 30.0–36.0)
MCV: 99.5 fL (ref 80.0–100.0)
Monocytes Absolute: 0.3 10*3/uL (ref 0.1–1.0)
Monocytes Relative: 2 %
Neutro Abs: 10.2 10*3/uL — ABNORMAL HIGH (ref 1.7–7.7)
Neutrophils Relative %: 91 %
Platelets: 241 10*3/uL (ref 150–400)
RBC: 4.36 MIL/uL (ref 4.22–5.81)
RDW: 13.7 % (ref 11.5–15.5)
WBC: 11.2 10*3/uL — ABNORMAL HIGH (ref 4.0–10.5)
nRBC: 0 % (ref 0.0–0.2)

## 2019-10-27 LAB — COMPREHENSIVE METABOLIC PANEL
ALT: 28 U/L (ref 0–44)
AST: 34 U/L (ref 15–41)
Albumin: 3.2 g/dL — ABNORMAL LOW (ref 3.5–5.0)
Alkaline Phosphatase: 57 U/L (ref 38–126)
Anion gap: 8 (ref 5–15)
BUN: 37 mg/dL — ABNORMAL HIGH (ref 8–23)
CO2: 27 mmol/L (ref 22–32)
Calcium: 9.6 mg/dL (ref 8.9–10.3)
Chloride: 119 mmol/L — ABNORMAL HIGH (ref 98–111)
Creatinine, Ser: 1.15 mg/dL (ref 0.61–1.24)
GFR calc Af Amer: >60 mL/min (ref >60)
GFR calc non Af Amer: 57 mL/min — ABNORMAL LOW (ref >60)
Glucose, Bld: 158 mg/dL — ABNORMAL HIGH (ref 70–99)
Potassium: 4.5 mmol/L (ref 3.5–5.1)
Sodium: 154 mmol/L — ABNORMAL HIGH (ref 135–145)
Total Bilirubin: 0.7 mg/dL (ref 0.3–1.2)
Total Protein: 8.1 g/dL (ref 6.5–8.1)

## 2019-10-27 LAB — PREPARE FRESH FROZEN PLASMA: Unit division: 0

## 2019-10-27 LAB — PHOSPHORUS: Phosphorus: 3.3 mg/dL (ref 2.5–4.6)

## 2019-10-27 LAB — PROCALCITONIN: Procalcitonin: 0.1 ng/mL

## 2019-10-27 LAB — C-REACTIVE PROTEIN: CRP: 9.7 mg/dL — ABNORMAL HIGH (ref <1.0)

## 2019-10-27 LAB — FERRITIN: Ferritin: 990 ng/mL — ABNORMAL HIGH (ref 24–336)

## 2019-10-27 LAB — BPAM FFP
Blood Product Expiration Date: 202012192205
ISSUE DATE / TIME: 202012182333
Unit Type and Rh: 9500

## 2019-10-27 LAB — MAGNESIUM: Magnesium: 2.3 mg/dL (ref 1.7–2.4)

## 2019-10-27 LAB — LACTIC ACID, PLASMA: Lactic Acid, Venous: 2.4 mmol/L (ref 0.5–1.9)

## 2019-10-27 LAB — D-DIMER, QUANTITATIVE: D-Dimer, Quant: 8.55 ug/mL-FEU — ABNORMAL HIGH (ref 0.00–0.50)

## 2019-10-27 MED ORDER — DEXTROSE 5 % IV SOLN: INTRAVENOUS | Status: DC

## 2019-10-27 NOTE — Progress Notes (Signed)
[facility] PROGRESS NOTE    Progress Note  Randy Chandler  [id number] DOB: 1930/08/16 DOA: 10/25/2019 PCP: Terressa Koyanagi, MD     Brief Narrative:   Randy Chandler is an 85 y.o. male past medical history of non-Hodgkin's B-cell lymphoma, dementia, essential hypertension hyperlipidemia who presents with altered mental status dehydration.  Recently tested positive for COVID-19, as aches confusion got worse from baseline he was sent to the ED.  Most of the history was obtained from the chart as the patient could not provide history.  In the ED he was found to a sodium of 160 with acute kidney injury.  Chest x-ray showed bilateral infiltrates and was not hypoxic on admission.  Assessment/Plan:   Acute metabolic encephalopathy: Secondary to acute kidney injury, hypernatremia. Had a nice conversation with the daughter this morning who is a physician over the treatment field. She agreed with the treatment plan will continue treating him conservatively and see how he responds. At this point will put him n.p.o. and will do a swallowing evaluation.    Hypovolemic hypernatremia: Sodium of 162, with D5W his sodium is improving to 159, his basic metabolic panel this morning shows that his sodium continues to improve nicely.  We will limit the improvement to 14 mEq in 24 hours.  COVID-19 infection: Chest x-ray did not show any infiltrates, he is saturating greater 92% on room air. His inflammatory markers are significantly elevated, discontinue dexamethasone as he is not hypoxic. His D-dimer is significantly elevated, he is not tachycardic or hypoxic we will continue to monitor.  AKI (acute kidney injury) ([place]) With a baseline less than 1, on admission 1.4 started on D5W his creatinine is improving nicely.  We will continue D5W recheck a basic metabolic panel in the morning.  Basic metabolic panel today is pending.  DVT prophylaxis: Lovenox Family Communication:daughter physician at  [place] to D/C: unable to determine Code Status:     Code Status Orders  (From admission, onward)         Start     Ordered   10/25/19 1856  Do not attempt resuscitation (DNR)  Continuous    Question Answer Comment  In the event of cardiac or respiratory ARREST Do not call a "code blue"   In the event of cardiac or respiratory ARREST Do not perform Intubation, CPR, defibrillation or ACLS   In the event of cardiac or respiratory ARREST Use medication by any route, position, wound care, and other measures to relive pain and suffering. May use oxygen, suction and manual treatment of airway obstruction as needed for comfort.      10/25/19 1855        Code Status History    This patient has a current code status but no historical code status.   Advance Care Planning Activity        IV Access:    Peripheral IV   Procedures and diagnostic studies:   DG Chest Port 1 View  Result Date: 10/26/2019 CLINICAL DATA:  Shortness of breath EXAM: PORTABLE CHEST 1 VIEW COMPARISON:  10/25/2019 FINDINGS: The heart size and mediastinal contours are within normal limits. Calcific aortic knob. No focal airspace consolidation. No pleural effusion. There is a thin curvilinear line at the lateral aspect of the right hemithorax which may reflect a skin fold versus a small pneumothorax. This was not seen on the previous exam. The visualized skeletal structures are unremarkable. IMPRESSION: Possible small (<10%) right-sided pneumothorax. Repeat upright expiratory PA chest radiograph could  be performed to confirm. These results will be called to the ordering clinician or representative by the Radiologist Assistant, and communication documented in the PACS or zVision Dashboard. Electronically Signed   By: Davina Poke M.D.   On: 10/26/2019 11:38   DG Chest Portable 1 View  Result Date: 10/25/2019 CLINICAL DATA:  COVID-19. EXAM: PORTABLE CHEST 1 VIEW COMPARISON:  None.  FINDINGS: The heart size and mediastinal contours are within normal limits. Both lungs are clear. The visualized skeletal structures are unremarkable. IMPRESSION: No active disease. Electronically Signed   By: Marijo Conception M.D.   On: 10/25/2019 14:01     Medical Consultants:    None.  Anti-Infectives:   IV remdesivir  Subjective:    Randy Chandler nonverbal  Objective:    Vitals:   10/26/19 1800 10/26/19 2030 10/27/19 0001 10/27/19 0500  BP:  103/77 (!) 169/98 (!) 148/84  Pulse:  89 89 95  Resp: 14 19    Temp:  98.6 F (37 C) 98.2 F (36.8 C) 98.4 F (36.9 C)  TempSrc:  Axillary Axillary Oral  SpO2:  100% 96% 100%  Weight:    (S) 51.9 kg  Height:       SpO2: 100 %   Intake/Output Summary (Last 24 hours) at 10/27/2019 0736 Last data filed at 10/27/2019 0700 Gross per 24 hour  Intake 1057.59 ml  Output 300 ml  Net 757.59 ml   Filed Weights   10/25/19 1859 10/26/19 0600 10/27/19 0500  Weight: 46.3 kg 46.6 kg (S) 51.9 kg    Exam: General exam: In no acute distress, cachectic appearing Respiratory system: Good air movement and clear to auscultation. Cardiovascular system: S1 & S2 heard, RRR.  Gastrointestinal system: Abdomen is nondistended, soft and nontender.  Central nervous system: Moving all 4 extremities without any difficulties. Extremities: No pedal edema. Skin: No rashes, lesions or ulcers  Data Reviewed:    Labs: Basic Metabolic Panel: Recent Labs  Lab 10/25/19 1658 10/25/19 2056 10/26/19 0443 10/26/19 0741 10/26/19 1225  NA 162* 164* 162* 160* 159*  K 4.5 4.8 4.9 4.5 4.3  CL 120* 126* 125* 124* 121*  CO2 30 25 27 26 28   GLUCOSE 110* 111* 162* 129* 166*  BUN 62* 52* 48* 46* 44*  CREATININE 1.55* 1.48* 1.27* 1.34* 1.26*  CALCIUM 10.0 9.7 9.6 9.5 9.6  MG  --   --  2.8*  --   --   PHOS  --   --  3.2  --   --    GFR Estimated Creatinine Clearance: 30.3 mL/min (A) (by C-G formula based on SCr of 1.26 mg/dL (H)). Liver Function  Tests: Recent Labs  Lab 10/25/19 1246 10/26/19 0443  AST 41 38  ALT 33 29  ALKPHOS 61 55  BILITOT 0.7 0.4  PROT 9.7* 8.5*  ALBUMIN 3.5 3.3*   Recent Labs  Lab 10/25/19 1246  LIPASE 36   No results for input(s): AMMONIA in the last 168 hours. Coagulation profile No results for input(s): INR, PROTIME in the last 168 hours. COVID-19 Labs  Recent Labs    10/25/19 1658 10/26/19 0443  DDIMER 18.29* 11.02*  FERRITIN 1,398* 944*  LDH 321*  --   CRP 21.0* 18.0*    Lab Results  Component Value Date   SARSCOV2NAA POSITIVE (A) 10/25/2019    CBC: Recent Labs  Lab 10/25/19 1246 10/26/19 0443 10/26/19 2020  WBC 13.3* 10.3 11.3*  NEUTROABS 11.2* 9.4*  --   HGB 14.2 12.6* 12.7*  HCT 47.9 43.4 42.2  MCV 100.0 102.1* 99.5  PLT 351 255 244   Cardiac Enzymes: Recent Labs  Lab 10/25/19 1246  CKTOTAL 408*   BNP (last 3 results) No results for input(s): PROBNP in the last 8760 hours. CBG: Recent Labs  Lab 10/26/19 0850 10/26/19 1125 10/26/19 1809 10/26/19 2348 10/27/19 0500  GLUCAP 108* 125* 99 146* 150*   D-Dimer: Recent Labs    10/25/19 1658 10/26/19 0443  DDIMER 18.29* 11.02*   Hgb A1c: No results for input(s): HGBA1C in the last 72 hours. Lipid Profile: No results for input(s): CHOL, HDL, LDLCALC, TRIG, CHOLHDL, LDLDIRECT in the last 72 hours. Thyroid function studies: Recent Labs    10/26/19 0443  TSH 1.157   Anemia work up: Recent Labs    10/25/19 1658 10/26/19 0443  FERRITIN 1,398* 944*   Sepsis Labs: Recent Labs  Lab 10/25/19 1246 10/25/19 1658 10/25/19 1740 10/26/19 0443 10/26/19 2020  PROCALCITON  --  0.19  --  0.10  --   WBC 13.3*  --   --  10.3 11.3*  LATICACIDVEN 2.9*  --  2.1*  --   --    Microbiology Recent Results (from the past 240 hour(s))  Urine culture     Status: None   Collection Time: 10/25/19  3:00 PM   Specimen: Urine, Random  Result Value Ref Range Status   Specimen Description   Final    URINE,  RANDOM Performed at [facility], [place] [address], [place], [place] [address]    Special Requests   Final    NONE Performed at [facility], [place] [address], [place], [place] [address]    Culture   Final    NO GROWTH Performed at [facility], [place] [address], [place], [place] [address]    Report Status 10/26/2019 FINAL  Final  SARS CORONAVIRUS 2 (TAT 6-24 HRS) Nasopharyngeal Nasopharyngeal Swab     Status: Abnormal   Collection Time: 10/25/19  4:58 PM   Specimen: Nasopharyngeal Swab  Result Value Ref Range Status   SARS Coronavirus 2 POSITIVE (A) NEGATIVE Final    Comment: RESULT CALLED TO, READ BACK BY AND VERIFIED WITH: MBland Span 0110 10/26/2019 T. TYSOR (NOTE) SARS-CoV-2 target nucleic acids are DETECTED. The SARS-CoV-2 RNA is generally detectable in upper and lower respiratory specimens during the acute phase of infection. Positive results are indicative of the presence of SARS-CoV-2 RNA. Clinical correlation with patient history and other diagnostic information is  necessary to determine patient infection status. Positive results do not rule out bacterial infection or co-infection with other viruses.  The expected result is Negative. Fact Sheet for Patients: SugarRoll.be Fact Sheet for Healthcare Providers: https://www.woods-mathews.com/ This test is not yet approved or cleared by the [place] FDA and  has been authorized for detection and/or diagnosis of SARS-CoV-2 by FDA under an Emergency Use Authorization (EUA). This EUA will remain  in effect (meaning this test can be used) for t he duration of the COVID-19 declaration under Section 564(b)(1) of the Act, 21 U.S.C. section 360bbb-3(b)(1), unless the authorization is terminated or revoked sooner. Performed at [facility], [place] [address], [place], [place] [address]   MRSA PCR Screening     Status: None   Collection  Time: 10/25/19  7:08 PM   Specimen: Nasal Mucosa; Nasopharyngeal  Result Value Ref Range Status   MRSA by PCR NEGATIVE NEGATIVE Final    Comment:  The GeneXpert MRSA Assay (FDA approved for NASAL specimens only), is one component of a comprehensive MRSA colonization surveillance program. It is not intended to diagnose MRSA infection nor to guide or monitor treatment for MRSA infections. Performed at [facility], [place] [address] [place], [place] [address]      Medications:   . Chlorhexidine Gluconate Cloth  6 each Topical Daily  . cholecalciferol  5,000 Units Oral Daily  . dexamethasone (DECADRON) injection  6 mg Intravenous Q24H  . dorzolamide-timolol  1 drop Both Eyes BID  . free water  400 mL Per Tube Q8H  . heparin  7,500 Units Subcutaneous Q8H  . insulin aspart  0-9 Units Subcutaneous Q4H  . latanoprost  1 drop Both Eyes QHS  . mouth rinse  15 mL Mouth Rinse BID  . QUEtiapine  12.5 mg Oral Daily  . QUEtiapine  50 mg Oral Daily  . tamsulosin  0.4 mg Oral QPC supper  . traZODone  50 mg Oral QHS   Continuous Infusions: . dextrose 5 % with KCl 20 mEq / L 20 mEq (10/27/19 0527)     LOS: 2 days   Charlynne Cousins  [facility]  10/27/2019, 7:36 AM

## 2019-10-27 NOTE — Plan of Care (Signed)
Report received from day shift nurse Rico Junker, RN. Pt currently does not have NG tube and day shift nurse reports that attending MD said not to place one. Pt appears calm, resting comfortably, fall precautions in place

## 2019-10-27 NOTE — Progress Notes (Signed)
1500- Update given to patient's daughter. MD spent time in room discussed patient care with daughter who is also a physician. Time allowed for questions.

## 2019-10-27 NOTE — Plan of Care (Signed)

## 2019-10-28 LAB — COMPREHENSIVE METABOLIC PANEL
ALT: 31 U/L (ref 0–44)
AST: 42 U/L — ABNORMAL HIGH (ref 15–41)
Albumin: 3.1 g/dL — ABNORMAL LOW (ref 3.5–5.0)
Alkaline Phosphatase: 70 U/L (ref 38–126)
Anion gap: 14 (ref 5–15)
BUN: 27 mg/dL — ABNORMAL HIGH (ref 8–23)
CO2: 19 mmol/L — ABNORMAL LOW (ref 22–32)
Calcium: 9.4 mg/dL (ref 8.9–10.3)
Chloride: 113 mmol/L — ABNORMAL HIGH (ref 98–111)
Creatinine, Ser: 1.04 mg/dL (ref 0.61–1.24)
GFR calc Af Amer: >60 mL/min (ref >60)
GFR calc non Af Amer: >60 mL/min (ref >60)
Glucose, Bld: 133 mg/dL — ABNORMAL HIGH (ref 70–99)
Potassium: 4.5 mmol/L (ref 3.5–5.1)
Sodium: 146 mmol/L — ABNORMAL HIGH (ref 135–145)
Total Bilirubin: 0.8 mg/dL (ref 0.3–1.2)
Total Protein: 7.8 g/dL (ref 6.5–8.1)

## 2019-10-28 LAB — CBC WITH DIFFERENTIAL/PLATELET
Abs Immature Granulocytes: 0.19 10*3/uL — ABNORMAL HIGH (ref 0.00–0.07)
Basophils Absolute: 0 10*3/uL (ref 0.0–0.1)
Basophils Relative: 0 %
Eosinophils Absolute: 0 10*3/uL (ref 0.0–0.5)
Eosinophils Relative: 0 %
HCT: 43.4 % (ref 39.0–52.0)
Hemoglobin: 13.4 g/dL (ref 13.0–17.0)
Immature Granulocytes: 2 %
Lymphocytes Relative: 8 %
Lymphs Abs: 0.9 10*3/uL (ref 0.7–4.0)
MCH: 30 pg (ref 26.0–34.0)
MCHC: 30.9 g/dL (ref 30.0–36.0)
MCV: 97.1 fL (ref 80.0–100.0)
Monocytes Absolute: 0.7 10*3/uL (ref 0.1–1.0)
Monocytes Relative: 6 %
Neutro Abs: 10.1 10*3/uL — ABNORMAL HIGH (ref 1.7–7.7)
Neutrophils Relative %: 84 %
Platelets: 202 10*3/uL (ref 150–400)
RBC: 4.47 MIL/uL (ref 4.22–5.81)
RDW: 13.2 % (ref 11.5–15.5)
WBC: 11.9 10*3/uL — ABNORMAL HIGH (ref 4.0–10.5)
nRBC: 0 % (ref 0.0–0.2)

## 2019-10-28 LAB — GLUCOSE, CAPILLARY
Glucose-Capillary: 109 mg/dL — ABNORMAL HIGH (ref 70–99)
Glucose-Capillary: 52 mg/dL — ABNORMAL LOW (ref 70–99)
Glucose-Capillary: 108 mg/dL — ABNORMAL HIGH (ref 70–99)
Glucose-Capillary: 86 mg/dL (ref 70–99)
Glucose-Capillary: 99 mg/dL (ref 70–99)
Glucose-Capillary: 82 mg/dL (ref 70–99)
Glucose-Capillary: 109 mg/dL — ABNORMAL HIGH (ref 70–99)

## 2019-10-28 LAB — D-DIMER, QUANTITATIVE: D-Dimer, Quant: 6.95 ug/mL-FEU — ABNORMAL HIGH (ref 0.00–0.50)

## 2019-10-28 LAB — C-REACTIVE PROTEIN: CRP: 4.1 mg/dL — ABNORMAL HIGH (ref <1.0)

## 2019-10-28 LAB — MAGNESIUM: Magnesium: 2 mg/dL (ref 1.7–2.4)

## 2019-10-28 LAB — PHOSPHORUS: Phosphorus: 2.9 mg/dL (ref 2.5–4.6)

## 2019-10-28 LAB — FERRITIN: Ferritin: 1001 ng/mL — ABNORMAL HIGH (ref 24–336)

## 2019-10-28 MED ORDER — DEXTROSE 50 % IV SOLN: 12.5000 g | INTRAVENOUS | Status: AC

## 2019-10-28 MED ORDER — DEXTROSE 50 % IV SOLN
INTRAVENOUS | Status: AC
  Administered 2019-10-28: 25 mL via INTRAVENOUS
  Filled 2019-10-28: qty 50

## 2019-10-28 MED ORDER — DEXTROSE 10 % IV SOLN: INTRAVENOUS | Status: AC

## 2019-10-28 NOTE — Progress Notes (Signed)
1000- Spoke with patient's daughter Sharyn Lull. Updates given including change in IVF, speech evaluation and patient status.

## 2019-10-28 NOTE — Evaluation (Signed)
Clinical/Bedside Swallow Evaluation Patient Details  Name: Randy Chandler MRN: [id number] Date of Birth: 1930-06-14  Today's Date: 10/28/2019 Time: SLP Start Time (ACUTE ONLY): 0913 SLP Stop Time (ACUTE ONLY): 0930 SLP Time Calculation (min) (ACUTE ONLY): 17 min  Past Medical History:  Past Medical History:  Diagnosis Date  . Arthritis   . BPH (benign prostatic hyperplasia)   . Coronary artery disease   . Osteoarthritis    Past Surgical History:  Past Surgical History:  Procedure Laterality Date  . COLONOSCOPY    . THORACOTOMY     HPI:  Pt is an 85 yo male, recently tested positive for COVID-19, who presents with AMS and dehydration. CXR on admission without active disease. PMH: non-Hodgkin's B-cell lymphoma, dementia, HTN, HLD   Assessment / Plan / Recommendation Clinical Impression  Pt shows no overt s/s of aspiration, but is at risk given mentation and positioning. He is resistant to SLP attempts at repositioning, needing the use of reverse Trendelenburg to help get pt more upright. He will not open his mouth to command, opening it a small amount only accept POs. His awareness initially is poor, but as trials continue, his automaticity kicks in more. He progressed from spoonfuls up to straw sips of water. His oral transit is slow at times with purees, but I was able to see in his mouth briefly at the end of the evaluation to make sure his oral cavity was clear. Recommend starting Dys 1 diet and thin liquids; SLP f/u indicated for tolerance and potential to advance if mentation can progress back toward his baseline.  SLP Visit Diagnosis: Dysphagia, oral phase (R13.11)    Aspiration Risk  Mild aspiration risk;Moderate aspiration risk;Risk for inadequate nutrition/hydration    Diet Recommendation Dysphagia 1 (Puree);Thin liquid   Liquid Administration via: Straw;Spoon;Cup Medication Administration: Crushed with puree Supervision: Staff to assist with self feeding;Full  supervision/cueing for compensatory strategies Compensations: Minimize environmental distractions;Slow rate;Small sips/bites;Follow solids with liquid Postural Changes: Seated upright at 90 degrees    Other  Recommendations Oral Care Recommendations: Oral care QID Other Recommendations: Have oral suction available   Follow up Recommendations Skilled Nursing facility      Frequency and Duration min 2x/week  2 weeks       Prognosis Prognosis for Safe Diet Advancement: Fair Barriers to Reach Goals: Cognitive deficits      Swallow Study   General HPI: Pt is an 85 yo male, recently tested positive for COVID-19, who presents with AMS and dehydration. CXR on admission without active disease. PMH: non-Hodgkin's B-cell lymphoma, dementia, HTN, HLD Type of Study: Bedside Swallow Evaluation Previous Swallow Assessment: none in chart Diet Prior to this Study: NPO Temperature Spikes Noted: No Respiratory Status: Room air History of Recent Intubation: No Behavior/Cognition: Alert;Cooperative;Doesn't follow directions Oral Cavity Assessment: Dry(won't open his mouth much but lips are dry, cracked) Oral Care Completed by SLP: No Oral Cavity - Dentition: (difficulty visualizing) Self-Feeding Abilities: Total assist Patient Positioning: Postural control adequate for testing(pt resistent to repositioning; reverse trendelenburg used) Baseline Vocal Quality: Low vocal intensity    Oral/Motor/Sensory Function Overall Oral Motor/Sensory Function: (not following commands to assess; seems symmetrical)   Ice Chips Ice chips: Within functional limits Presentation: Spoon   Thin Liquid Thin Liquid: Impaired Presentation: Spoon;Straw Oral Phase Impairments: Poor awareness of bolus    Nectar Thick Nectar Thick Liquid: Not tested   Honey Thick Honey Thick Liquid: Not tested   Puree Puree: Impaired Presentation: Spoon Oral Phase Impairments: Poor awareness of bolus  Oral Phase Functional Implications:  Prolonged oral transit   Solid     Solid: Not tested      [name] Loyd [place] 10/28/2019,9:42 AM  Pollyann Glen, M.A. [place] Acute Environmental education officer [phone] Office [phone]

## 2019-10-28 NOTE — Evaluation (Signed)
Physical Therapy Evaluation Patient Details Name: Randy Chandler MRN: [id number] DOB: 07/20/1930 Today's Date: 10/28/2019   History of Present Illness  Pt is an 85 yo male, recently tested positive for COVID-19, who presents with AMS and dehydration. CXR on admission without active disease. PMH: non-Hodgkin's B-cell lymphoma, dementia, HTN, HLD   Clinical Impression   Pt admitted with above diagnosis. Pt is a poor historian, hx all from chart. PTA pt was living in memory care unit at SNF, chart reports he was talkative, ambulatory and even participated in dance classes. Pt currently with functional limitations due to the deficits listed below (see PT Problem List). This am pt is presenting with very poor cognition and barely able to follow simple instructions. He is heavily guarding against any movement and also seems to be apprehensive about mobility. Pt needing max a to get from supine to sit edge of bed, attempted multiple times to stand with pt but pt unable to stand without max a x 2, even with this pt is not quite standing as he had his feet too far infront of him to be able to weight bear on them. Pt was on room air throughout session and sats remained in 90s throughout. Pt will benefit from skilled PT to increase their independence and safety with mobility to allow discharge to the venue listed below.       Follow Up Recommendations SNF    Equipment Recommendations  None recommended by PT    Recommendations for Other Services       Precautions / Restrictions Precautions Precautions: Fall Precaution Comments: cognition, B hands in mitts Restrictions Weight Bearing Restrictions: No      Mobility  Bed Mobility Overal bed mobility: Needs Assistance Bed Mobility: Sit to Supine;Supine to Sit     Supine to sit: Max assist Sit to supine: Max assist   General bed mobility comments: apprehensive about any mobility  Transfers Overall transfer level: Needs assistance Equipment  used: 2 person hand held assist Transfers: Sit to/from Stand Sit to Stand: +2 physical assistance         General transfer comment: attempted to stand several times but pt gurading against mobility, with +2 staff assist was able to stand briefly but pt does not not attempt to sand, feet are extended way infront of him  Ambulation/Gait             General Gait Details: unable to ambulate during this assessment  Stairs            Wheelchair Mobility    Modified Rankin (Stroke Patients Only)       Balance Overall balance assessment: Needs assistance Sitting-balance support: Feet unsupported;No upper extremity supported Sitting balance-Leahy Scale: Good       Standing balance-Leahy Scale: Poor                               Pertinent Vitals/Pain Pain Assessment: Faces Faces Pain Scale: No hurt    Home Living Family/patient expects to be discharged to:: Skilled nursing facility                 Additional Comments: lives in memory care unit    Prior Function Level of Independence: Needs assistance   Gait / Transfers Assistance Needed: as per chart pt was ambulatory and going to dance classes  ADL's / Homemaking Assistance Needed: staff assisted as needed        Hand Dominance  Extremity/Trunk Assessment   Upper Extremity Assessment Upper Extremity Assessment: Overall WFL for tasks assessed    Lower Extremity Assessment Lower Extremity Assessment: Overall WFL for tasks assessed       Communication   Communication: Receptive difficulties;Expressive difficulties  Cognition Arousal/Alertness: Lethargic Behavior During Therapy: Agitated;Anxious Overall Cognitive Status: Impaired/Different from baseline Area of Impairment: Orientation;Attention;Memory;Following commands;Safety/judgement;Awareness;Problem solving                 Orientation Level: Disoriented to;Place;Time;Situation Current Attention Level:  Alternating Memory: Decreased recall of precautions;Decreased short-term memory Following Commands: Follows one step commands inconsistently Safety/Judgement: Decreased awareness of safety;Decreased awareness of deficits   Problem Solving: Requires verbal cues;Requires tactile cues;Slow processing;Decreased initiation;Difficulty sequencing        General Comments      Exercises     Assessment/Plan    PT Assessment Patient needs continued PT services  PT Problem List Decreased strength;Decreased range of motion;Decreased activity tolerance;Decreased balance;Decreased mobility;Decreased coordination;Decreased cognition;Decreased knowledge of use of DME;Decreased safety awareness       PT Treatment Interventions DME instruction;Gait training;Functional mobility training;Therapeutic activities;Therapeutic exercise;Balance training;Neuromuscular re-education;Patient/family education    PT Goals (Current goals can be found in the Care Plan section)  Acute Rehab PT Goals Patient Stated Goal: unable to state goals this am PT Goal Formulation: Patient unable to participate in goal setting Time For Goal Achievement: 11/11/19 Potential to Achieve Goals: Fair    Frequency Min 2X/week   Barriers to discharge        Co-evaluation               AM-PAC PT "6 Clicks" Mobility  Outcome Measure Help needed turning from your back to your side while in a flat bed without using bedrails?: A Lot Help needed moving from lying on your back to sitting on the side of a flat bed without using bedrails?: A Lot Help needed moving to and from a bed to a chair (including a wheelchair)?: Total Help needed standing up from a chair using your arms (e.g., wheelchair or bedside chair)?: A Lot Help needed to walk in hospital room?: Total Help needed climbing 3-5 steps with a railing? : Total 6 Click Score: 9    End of Session   Activity Tolerance: Other (comment)(tx limited by cognition) Patient  left: in bed;with call bell/phone within reach;with bed alarm set Nurse Communication: Mobility status PT Visit Diagnosis: Unsteadiness on feet (R26.81);Other abnormalities of gait and mobility (R26.89)    Time: [id number] PT Time Calculation (min) (ACUTE ONLY): 33 min   Charges:   PT Evaluation $PT Eval Moderate Complexity: 1 Mod PT Treatments $Therapeutic Activity: 8-22 mins        Horald Chestnut, PT   Delford Field 10/28/2019, 1:29 PM

## 2019-10-28 NOTE — Plan of Care (Signed)
  Problem: Education: Goal: Knowledge of General Education information will improve Description: Including pain rating scale, medication(s)/side effects and non-pharmacologic comfort measures Outcome: Progressing   Problem: Health Behavior/Discharge Planning: Goal: Ability to manage health-related needs will improve Outcome: Progressing   Problem: Clinical Measurements: Goal: Diagnostic test results will improve Outcome: Progressing   Problem: Activity: Goal: Risk for activity intolerance will decrease Outcome: Progressing   Problem: Nutrition: Goal: Adequate nutrition will be maintained Outcome: Progressing   Problem: Safety: Goal: Ability to remain free from injury will improve Outcome: Progressing   Problem: Skin Integrity: Goal: Risk for impaired skin integrity will decrease Outcome: Progressing

## 2019-10-28 NOTE — Progress Notes (Signed)
[facility] PROGRESS NOTE    Progress Note  Randy Chandler  [id number] DOB: Jan 30, 1930 DOA: 10/25/2019 PCP: Terressa Koyanagi, MD     Brief Narrative:   Randy Chandler is an 85 y.o. male past medical history of non-Hodgkin's B-cell lymphoma, dementia, essential hypertension hyperlipidemia who presents with altered mental status dehydration.  Recently tested positive for COVID-19, as aches confusion got worse from baseline he was sent to the ED.  Most of the history was obtained from the chart as the patient could not provide history.  In the ED he was found to a sodium of 160 with acute kidney injury.  Chest x-ray showed bilateral infiltrates and was not hypoxic on admission.  Assessment/Plan:   Acute metabolic encephalopathy: Secondary to acute kidney injury, viral infection and hypernatremia. Patient remains nonverbal and not able to communicate. Following evaluation is pending.    Hypovolemic hypernatremia: His sodium seems to be improving to 0000000, basic metabolic panel this morning is pending. Continue IV fluids, will change him to D10W.  See below for further details.  COVID-19 infection: His inflammatory markers are significantly elevated but improving, he is not hypoxic. Continue conservative management.  AKI (acute kidney injury) ([place]) Likely prerenal azotemia in the setting of infectious etiology. Baseline creatinine of less than 1 on admission 1.4 he was started on D5W and his creatinine is 1 today  Mild hyperglycemia: He is not getting insulin, change IV fluids to D10W  DVT prophylaxis: Lovenox Family Communication:daughter physician at [place] to D/C: unable to determine Code Status:     Code Status Orders  (From admission, onward)         Start     Ordered   10/25/19 1856  Do not attempt resuscitation (DNR)  Continuous    Question Answer Comment  In the event of cardiac or respiratory ARREST Do not call a "code blue"   In  the event of cardiac or respiratory ARREST Do not perform Intubation, CPR, defibrillation or ACLS   In the event of cardiac or respiratory ARREST Use medication by any route, position, wound care, and other measures to relive pain and suffering. May use oxygen, suction and manual treatment of airway obstruction as needed for comfort.      10/25/19 1855        Code Status History    This patient has a current code status but no historical code status.   Advance Care Planning Activity        IV Access:    Peripheral IV   Procedures and diagnostic studies:   DG Chest Port 1 View  Result Date: 10/26/2019 CLINICAL DATA:  Shortness of breath EXAM: PORTABLE CHEST 1 VIEW COMPARISON:  10/25/2019 FINDINGS: The heart size and mediastinal contours are within normal limits. Calcific aortic knob. No focal airspace consolidation. No pleural effusion. There is a thin curvilinear line at the lateral aspect of the right hemithorax which may reflect a skin fold versus a small pneumothorax. This was not seen on the previous exam. The visualized skeletal structures are unremarkable. IMPRESSION: Possible small (<10%) right-sided pneumothorax. Repeat upright expiratory PA chest radiograph could be performed to confirm. These results will be called to the ordering clinician or representative by the Radiologist Assistant, and communication documented in the PACS or zVision Dashboard. Electronically Signed   By: Davina Poke M.D.   On: 10/26/2019 11:38     Medical Consultants:    None.  Anti-Infectives:   IV remdesivir  Subjective:  Randy Chandler nonverbal  Objective:    Vitals:   10/27/19 1958 10/27/19 2332 10/28/19 0405 10/28/19 0439  BP: (!) 155/80 (!) 143/99 (!) 149/100 (!) 160/76  Pulse: 90 99 86 77  Resp:  (!) 22 16   Temp: 98.4 F (36.9 C) 98.3 F (36.8 C) 97.7 F (36.5 C)   TempSrc: Oral Axillary Axillary   SpO2: 100% 100% 100%   Weight:      Height:       SpO2: 100  %   Intake/Output Summary (Last 24 hours) at 10/28/2019 0726 Last data filed at 10/28/2019 0436 Gross per 24 hour  Intake 1466.37 ml  Output 750 ml  Net 716.37 ml   Filed Weights   10/25/19 1859 10/26/19 0600 10/27/19 0500  Weight: 46.3 kg 46.6 kg (S) 51.9 kg    Exam: General exam: In no acute distress, nonverbal Respiratory system: Good air movement and clear to auscultation. Cardiovascular system: S1 & S2 heard, RRR. No JVD. Gastrointestinal system: Abdomen is nondistended, soft and nontender.  Extremities: No pedal edema. Skin: No rashes, lesions or ulcers  Data Reviewed:    Labs: Basic Metabolic Panel: Recent Labs  Lab 10/25/19 2056 10/26/19 0443 10/26/19 0741 10/26/19 1225 10/27/19 0606  NA 164* 162* 160* 159* 154*  K 4.8 4.9 4.5 4.3 4.5  CL 126* 125* 124* 121* 119*  CO2 25 27 26 28 27   GLUCOSE 111* 162* 129* 166* 158*  BUN 52* 48* 46* 44* 37*  CREATININE 1.48* 1.27* 1.34* 1.26* 1.15  CALCIUM 9.7 9.6 9.5 9.6 9.6  MG  --  2.8*  --   --  2.3  PHOS  --  3.2  --   --  3.3   GFR Estimated Creatinine Clearance: 33.2 mL/min (by C-G formula based on SCr of 1.15 mg/dL). Liver Function Tests: Recent Labs  Lab 10/25/19 1246 10/26/19 0443 10/27/19 0606  AST 41 38 34  ALT 33 29 28  ALKPHOS 61 55 57  BILITOT 0.7 0.4 0.7  PROT 9.7* 8.5* 8.1  ALBUMIN 3.5 3.3* 3.2*   Recent Labs  Lab 10/25/19 1246  LIPASE 36   No results for input(s): AMMONIA in the last 168 hours. Coagulation profile No results for input(s): INR, PROTIME in the last 168 hours. COVID-19 Labs  Recent Labs    10/25/19 1658 10/26/19 0443 10/27/19 0606  DDIMER 18.29* 11.02* 8.55*  FERRITIN 1,398* 944* 990*  LDH 321*  --   --   CRP 21.0* 18.0* 9.7*    Lab Results  Component Value Date   SARSCOV2NAA POSITIVE (A) 10/25/2019    CBC: Recent Labs  Lab 10/25/19 1246 10/26/19 0443 10/26/19 2020 10/27/19 0606  WBC 13.3* 10.3 11.3* 11.2*  NEUTROABS 11.2* 9.4*  --  10.2*  HGB 14.2  12.6* 12.7* 12.8*  HCT 47.9 43.4 42.2 43.4  MCV 100.0 102.1* 99.5 99.5  PLT 351 255 244 241   Cardiac Enzymes: Recent Labs  Lab 10/25/19 1246  CKTOTAL 408*   BNP (last 3 results) No results for input(s): PROBNP in the last 8760 hours. CBG: Recent Labs  Lab 10/27/19 1619 10/27/19 2028 10/27/19 2329 10/28/19 0426 10/28/19 0502  GLUCAP 89 125* 88 52* 108*   D-Dimer: Recent Labs    10/26/19 0443 10/27/19 0606  DDIMER 11.02* 8.55*   Hgb A1c: No results for input(s): HGBA1C in the last 72 hours. Lipid Profile: No results for input(s): CHOL, HDL, LDLCALC, TRIG, CHOLHDL, LDLDIRECT in the last 72 hours. Thyroid function studies: Recent  Labs    10/26/19 0443  TSH 1.157   Anemia work up: Recent Labs    10/26/19 0443 10/27/19 0606  FERRITIN 944* 990*   Sepsis Labs: Recent Labs  Lab 10/25/19 1246 10/25/19 1658 10/25/19 1740 10/26/19 0443 10/26/19 2020 10/27/19 0606  PROCALCITON  --  0.19  --  0.10  --  0.10  WBC 13.3*  --   --  10.3 11.3* 11.2*  LATICACIDVEN 2.9*  --  2.1*  --   --  2.4*   Microbiology Recent Results (from the past 240 hour(s))  Urine culture     Status: None   Collection Time: 10/25/19  3:00 PM   Specimen: Urine, Random  Result Value Ref Range Status   Specimen Description   Final    URINE, RANDOM Performed at [facility], [place] [address], [place], [place] [address]    Special Requests   Final    NONE Performed at [facility], [place] [address], [place], [place] [address]    Culture   Final    NO GROWTH Performed at [facility], [place] [address], [place], [place] [address]    Report Status 10/26/2019 FINAL  Final  SARS CORONAVIRUS 2 (TAT 6-24 HRS) Nasopharyngeal Nasopharyngeal Swab     Status: Abnormal   Collection Time: 10/25/19  4:58 PM   Specimen: Nasopharyngeal Swab  Result Value Ref Range Status   SARS Coronavirus 2 POSITIVE (A) NEGATIVE Final    Comment: RESULT CALLED TO, READ  BACK BY AND VERIFIED WITH: MBland Span 0110 10/26/2019 T. TYSOR (NOTE) SARS-CoV-2 target nucleic acids are DETECTED. The SARS-CoV-2 RNA is generally detectable in upper and lower respiratory specimens during the acute phase of infection. Positive results are indicative of the presence of SARS-CoV-2 RNA. Clinical correlation with patient history and other diagnostic information is  necessary to determine patient infection status. Positive results do not rule out bacterial infection or co-infection with other viruses.  The expected result is Negative. Fact Sheet for Patients: SugarRoll.be Fact Sheet for Healthcare Providers: https://www.woods-mathews.com/ This test is not yet approved or cleared by the [place] FDA and  has been authorized for detection and/or diagnosis of SARS-CoV-2 by FDA under an Emergency Use Authorization (EUA). This EUA will remain  in effect (meaning this test can be used) for t he duration of the COVID-19 declaration under Section 564(b)(1) of the Act, 21 U.S.C. section 360bbb-3(b)(1), unless the authorization is terminated or revoked sooner. Performed at [facility], [place] [address], [place], [place] [address]   MRSA PCR Screening     Status: None   Collection Time: 10/25/19  7:08 PM   Specimen: Nasal Mucosa; Nasopharyngeal  Result Value Ref Range Status   MRSA by PCR NEGATIVE NEGATIVE Final    Comment:        The GeneXpert MRSA Assay (FDA approved for NASAL specimens only), is one component of a comprehensive MRSA colonization surveillance program. It is not intended to diagnose MRSA infection nor to guide or monitor treatment for MRSA infections. Performed at [facility], [place] [address] [place], [place] [address]      Medications:   . Chlorhexidine Gluconate Cloth  6 each Topical Daily  . cholecalciferol  5,000 Units Oral Daily  . dorzolamide-timolol  1 drop Both Eyes  BID  . free water  400 mL Per Tube Q8H  . heparin  7,500 Units Subcutaneous Q8H  . insulin aspart  0-9 Units  Subcutaneous Q4H  . latanoprost  1 drop Both Eyes QHS  . mouth rinse  15 mL Mouth Rinse BID  . QUEtiapine  12.5 mg Oral Daily  . QUEtiapine  50 mg Oral Daily  . tamsulosin  0.4 mg Oral QPC supper  . traZODone  50 mg Oral QHS   Continuous Infusions: . dextrose 125 mL/hr at 10/28/19 0436     LOS: 3 days   Charlynne Cousins  [facility]  10/28/2019, 7:26 AM

## 2019-10-29 LAB — COMPREHENSIVE METABOLIC PANEL
ALT: 43 U/L (ref 0–44)
AST: 42 U/L — ABNORMAL HIGH (ref 15–41)
Albumin: 3.3 g/dL — ABNORMAL LOW (ref 3.5–5.0)
Alkaline Phosphatase: 59 U/L (ref 38–126)
Anion gap: 12 (ref 5–15)
BUN: 17 mg/dL (ref 8–23)
CO2: 26 mmol/L (ref 22–32)
Calcium: 9.4 mg/dL (ref 8.9–10.3)
Chloride: 105 mmol/L (ref 98–111)
Creatinine, Ser: 0.91 mg/dL (ref 0.61–1.24)
GFR calc Af Amer: >60 mL/min (ref >60)
GFR calc non Af Amer: >60 mL/min (ref >60)
Glucose, Bld: 97 mg/dL (ref 70–99)
Potassium: 3.5 mmol/L (ref 3.5–5.1)
Sodium: 143 mmol/L (ref 135–145)
Total Bilirubin: 1 mg/dL (ref 0.3–1.2)
Total Protein: 7.9 g/dL (ref 6.5–8.1)

## 2019-10-29 LAB — GLUCOSE, CAPILLARY
Glucose-Capillary: 67 mg/dL — ABNORMAL LOW (ref 70–99)
Glucose-Capillary: 80 mg/dL (ref 70–99)
Glucose-Capillary: 176 mg/dL — ABNORMAL HIGH (ref 70–99)
Glucose-Capillary: 100 mg/dL — ABNORMAL HIGH (ref 70–99)
Glucose-Capillary: 87 mg/dL (ref 70–99)
Glucose-Capillary: 71 mg/dL (ref 70–99)
Glucose-Capillary: 49 mg/dL — ABNORMAL LOW (ref 70–99)
Glucose-Capillary: 61 mg/dL — ABNORMAL LOW (ref 70–99)
Glucose-Capillary: 70 mg/dL (ref 70–99)

## 2019-10-29 LAB — PHOSPHORUS: Phosphorus: 3.4 mg/dL (ref 2.5–4.6)

## 2019-10-29 LAB — CBC WITH DIFFERENTIAL/PLATELET
Abs Immature Granulocytes: 0.12 10*3/uL — ABNORMAL HIGH (ref 0.00–0.07)
Basophils Absolute: 0 10*3/uL (ref 0.0–0.1)
Basophils Relative: 0 %
Eosinophils Absolute: 0.1 10*3/uL (ref 0.0–0.5)
Eosinophils Relative: 1 %
HCT: 41.5 % (ref 39.0–52.0)
Hemoglobin: 13.1 g/dL (ref 13.0–17.0)
Immature Granulocytes: 2 %
Lymphocytes Relative: 9 %
Lymphs Abs: 0.7 10*3/uL (ref 0.7–4.0)
MCH: 29.6 pg (ref 26.0–34.0)
MCHC: 31.6 g/dL (ref 30.0–36.0)
MCV: 93.9 fL (ref 80.0–100.0)
Monocytes Absolute: 0.6 10*3/uL (ref 0.1–1.0)
Monocytes Relative: 8 %
Neutro Abs: 6.1 10*3/uL (ref 1.7–7.7)
Neutrophils Relative %: 80 %
Platelets: 285 10*3/uL (ref 150–400)
RBC: 4.42 MIL/uL (ref 4.22–5.81)
RDW: 12.7 % (ref 11.5–15.5)
WBC: 7.6 10*3/uL (ref 4.0–10.5)
nRBC: 0 % (ref 0.0–0.2)

## 2019-10-29 LAB — D-DIMER, QUANTITATIVE: D-Dimer, Quant: 3.5 ug/mL-FEU — ABNORMAL HIGH (ref 0.00–0.50)

## 2019-10-29 LAB — MAGNESIUM: Magnesium: 2 mg/dL (ref 1.7–2.4)

## 2019-10-29 LAB — C-REACTIVE PROTEIN: CRP: 3.2 mg/dL — ABNORMAL HIGH (ref <1.0)

## 2019-10-29 LAB — FERRITIN: Ferritin: 889 ng/mL — ABNORMAL HIGH (ref 24–336)

## 2019-10-29 MED ORDER — DEXTROSE 50 % IV SOLN
25.0000 mL | Freq: Once | INTRAVENOUS | Status: AC
  Administered 2019-10-29: 25 mL via INTRAVENOUS
  Filled 2019-10-29: qty 50

## 2019-10-29 MED ORDER — DEXTROSE 50 % IV SOLN
INTRAVENOUS | Status: AC
  Administered 2019-10-30: 25 mL via INTRAVENOUS
  Filled 2019-10-29: qty 50

## 2019-10-29 MED ORDER — DEXTROSE 50 % IV SOLN
25.0000 g | INTRAVENOUS | Status: AC
  Administered 2019-10-29: 25 g via INTRAVENOUS
  Filled 2019-10-29: qty 50

## 2019-10-29 NOTE — Progress Notes (Signed)
  Speech Language Pathology Treatment: Dysphagia  Patient Details Name: Randy Chandler MRN: [id number] DOB: [birth year] Today's Date: 10/29/2019 Time: [id number] SLP Time Calculation (min) (ACUTE ONLY): 14 min  Assessment / Plan / Recommendation Clinical Impression  Pt is more lethargic today, having received Seroquel earlier in the day per RN. RN does share that this morning, when he was more alert, he did well with breakfast meal with no overt signs of difficulty. Pt responded to his name and verbal stimuli from SLP and opened his mouth to tactile stimulus (primarily the spoon). He accepted boluses but in this state of lethargy he orally held them, despite Max cues, until SLP used the yankauer to remove them. Further POs were held, but I think that he can stay on this current diet with full supervision to monitor for alertness and safety. SLP will continue to follow acutely.    HPI HPI: Pt is an 85 yo male, recently tested positive for COVID-19, who presents with AMS and dehydration. CXR on admission without active disease. PMH: non-Hodgkin's B-cell lymphoma, dementia, HTN, HLD      SLP Plan  Continue with current plan of care       Recommendations  Diet recommendations: Dysphagia 1 (puree);Thin liquid Liquids provided via: Cup;Straw Medication Administration: Crushed with puree Supervision: Staff to assist with self feeding;Full supervision/cueing for compensatory strategies Compensations: Minimize environmental distractions;Slow rate;Small sips/bites;Follow solids with liquid Postural Changes and/or Swallow Maneuvers: Seated upright 90 degrees                Oral Care Recommendations: Oral care QID Follow up Recommendations: Skilled Nursing facility SLP Visit Diagnosis: Dysphagia, oral phase (R13.11) Plan: Continue with current plan of care       GO                Randy Chandler Randy Chandler 10/29/2019, 2:19 PM  Randy Chandler, M.A. [place] Acute Environmental education officer  [phone] Office [phone]

## 2019-10-29 NOTE — Progress Notes (Addendum)
[facility] PROGRESS NOTE    Progress Note  Da Beisel  [id number] DOB: 1930/03/21 DOA: 10/25/2019 PCP: Terressa Koyanagi, MD     Brief Narrative:   Randy Chandler is an 85 y.o. male past medical history of non-Hodgkin's B-cell lymphoma, dementia, essential hypertension hyperlipidemia who presents with altered mental status dehydration.  Recently tested positive for COVID-19, as aches confusion got worse from baseline he was sent to the ED.  Most of the history was obtained from the chart as the patient could not provide history.  In the ED he was found to a sodium of 160 with acute kidney injury.  Chest x-ray showed bilateral infiltrates and was not hypoxic on admission.  Assessment/Plan:   Acute metabolic encephalopathy super imposed on Advance dementia: Probably secondary to acute kidney injury, viral infection and hyponatremia. Patient remains nonverbal. Had a long conversation with the daughter who is a physician (on 12.21.2020) about goals of care have explained to him that he has a poor prognosis especially as he is not eating or drinking,  Minimal oral intake fluids or solids on 10/29/2019. He had hypoglycemia on 10/28/2019.   She would like to give him 1 or 2 more days and if he does not improve then we can start thinking about comfort care. Had to resume D10 for hypoglycemia and was not able to contact daughter(who is a physician) on 12.22.2020. He has an extremely poor prognosis.    Hypovolemic hypernatremia: Hypernatremia has improved with IV fluid hydration. Try to encourage him to consume oral fluids.  COVID-19 infection: Inflamatorymarkers are slowly improving, he is not hypoxic, will continue conservative measures. He has significant poor oral intake. Have discussed goals of care with the daughter and she would like to give it 1 or 2 more days before deciding to move towards comfort care.  AKI (acute kidney injury) ([place]) Pre-renal azotemia in the setting of  infectious etiology and decreased oral intake, with a baseline creatinine of less than 1 has improved to 1 with IV fluids.  We will KVO today.  Mild hypoglycemia: Currently his blood glucose has been fairly controlled continue D5W at a lower rate.  DVT prophylaxis: Lovenox Family Communication:daughter physician at [place] to D/C: unable to determine Code Status:     Code Status Orders  (From admission, onward)         Start     Ordered   10/25/19 1856  Do not attempt resuscitation (DNR)  Continuous    Question Answer Comment  In the event of cardiac or respiratory ARREST Do not call a "code blue"   In the event of cardiac or respiratory ARREST Do not perform Intubation, CPR, defibrillation or ACLS   In the event of cardiac or respiratory ARREST Use medication by any route, position, wound care, and other measures to relive pain and suffering. May use oxygen, suction and manual treatment of airway obstruction as needed for comfort.      10/25/19 1855        Code Status History    This patient has a current code status but no historical code status.   Advance Care Planning Activity        IV Access:    Peripheral IV   Procedures and diagnostic studies:   No results found.   Medical Consultants:    None.  Anti-Infectives:   IV remdesivir  Subjective:    Randy Chandler nonverbal  Objective:    Vitals:   10/28/19 1153 10/28/19 1644 10/28/19  1939 10/29/19 0412  BP: (!) 155/99 (!) 151/86 (!) 147/85 (!) 144/92  Pulse: 96 67 99 90  Resp: 20 18 16 17   Temp: 97.8 F (36.6 C) 98.7 F (37.1 C) 98.2 F (36.8 C) 98.6 F (37 C)  TempSrc: Axillary Axillary Oral Axillary  SpO2: 100% 100% 100% 100%  Weight:      Height:       SpO2: 100 %   Intake/Output Summary (Last 24 hours) at 10/29/2019 0746 Last data filed at 10/29/2019 0500 Gross per 24 hour  Intake 1846.37 ml  Output 950 ml  Net 896.37 ml   Filed Weights    10/25/19 1859 10/26/19 0600 10/27/19 0500  Weight: 46.3 kg 46.6 kg (S) 51.9 kg    Exam: General exam: In no acute distress, nonverbal Respiratory system: Good air movement and clear to auscultation. Cardiovascular system: S1 & S2 heard, RRR. No JVD. Gastrointestinal system: Abdomen is nondistended, soft and nontender.  Extremities: No pedal edema. Skin: No rashes, lesions or ulcers  Data Reviewed:    Labs: Basic Metabolic Panel: Recent Labs  Lab 10/26/19 0443 10/26/19 0741 10/26/19 1225 10/27/19 0606 10/28/19 0538  NA 162* 160* 159* 154* 146*  K 4.9 4.5 4.3 4.5 4.5  CL 125* 124* 121* 119* 113*  CO2 27 26 28 27  19*  GLUCOSE 162* 129* 166* 158* 133*  BUN 48* 46* 44* 37* 27*  CREATININE 1.27* 1.34* 1.26* 1.15 1.04  CALCIUM 9.6 9.5 9.6 9.6 9.4  MG 2.8*  --   --  2.3 2.0  PHOS 3.2  --   --  3.3 2.9   GFR Estimated Creatinine Clearance: 36.7 mL/min (by C-G formula based on SCr of 1.04 mg/dL). Liver Function Tests: Recent Labs  Lab 10/25/19 1246 10/26/19 0443 10/27/19 0606 10/28/19 0538  AST 41 38 34 42*  ALT 33 29 28 31   ALKPHOS 61 55 57 70  BILITOT 0.7 0.4 0.7 0.8  PROT 9.7* 8.5* 8.1 7.8  ALBUMIN 3.5 3.3* 3.2* 3.1*   Recent Labs  Lab 10/25/19 1246  LIPASE 36   No results for input(s): AMMONIA in the last 168 hours. Coagulation profile No results for input(s): INR, PROTIME in the last 168 hours. COVID-19 Labs  Recent Labs    10/27/19 0606 10/28/19 0538  DDIMER 8.55* 6.95*  FERRITIN 990* 1,001*  CRP 9.7* 4.1*    Lab Results  Component Value Date   SARSCOV2NAA POSITIVE (A) 10/25/2019    CBC: Recent Labs  Lab 10/25/19 1246 10/26/19 0443 10/26/19 2020 10/27/19 0606 10/28/19 0538  WBC 13.3* 10.3 11.3* 11.2* 11.9*  NEUTROABS 11.2* 9.4*  --  10.2* 10.1*  HGB 14.2 12.6* 12.7* 12.8* 13.4  HCT 47.9 43.4 42.2 43.4 43.4  MCV 100.0 102.1* 99.5 99.5 97.1  PLT 351 255 244 241 202   Cardiac Enzymes: Recent Labs  Lab 10/25/19 1246  CKTOTAL 408*     BNP (last 3 results) No results for input(s): PROBNP in the last 8760 hours. CBG: Recent Labs  Lab 10/28/19 0748 10/28/19 1158 10/28/19 1646 10/28/19 2229 10/29/19 0424  GLUCAP 86 99 82 109* 100*   D-Dimer: Recent Labs    10/27/19 0606 10/28/19 0538  DDIMER 8.55* 6.95*   Hgb A1c: No results for input(s): HGBA1C in the last 72 hours. Lipid Profile: No results for input(s): CHOL, HDL, LDLCALC, TRIG, CHOLHDL, LDLDIRECT in the last 72 hours. Thyroid function studies: No results for input(s): TSH, T4TOTAL, T3FREE, THYROIDAB in the last 72 hours.  Invalid input(s):  FREET3 Anemia work up: Recent Labs    10/27/19 0606 10/28/19 0538  FERRITIN 990* 1,001*   Sepsis Labs: Recent Labs  Lab 10/25/19 1246 10/25/19 1658 10/25/19 1740 10/26/19 0443 10/26/19 2020 10/27/19 0606 10/28/19 0538  PROCALCITON  --  0.19  --  0.10  --  0.10  --   WBC 13.3*  --   --  10.3 11.3* 11.2* 11.9*  LATICACIDVEN 2.9*  --  2.1*  --   --  2.4*  --    Microbiology Recent Results (from the past 240 hour(s))  Urine culture     Status: None   Collection Time: 10/25/19  3:00 PM   Specimen: Urine, Random  Result Value Ref Range Status   Specimen Description   Final    URINE, RANDOM Performed at [facility], [place] [address], [place], [place] [address]    Special Requests   Final    NONE Performed at [facility], [place] [address] [place], [place] [address]    Culture   Final    NO GROWTH Performed at [facility], [place] [address], [place], [place] [address]    Report Status 10/26/2019 FINAL  Final  SARS CORONAVIRUS 2 (TAT 6-24 HRS) Nasopharyngeal Nasopharyngeal Swab     Status: Abnormal   Collection Time: 10/25/19  4:58 PM   Specimen: Nasopharyngeal Swab  Result Value Ref Range Status   SARS Coronavirus 2 POSITIVE (A) NEGATIVE Final    Comment: RESULT CALLED TO, READ BACK BY AND VERIFIED WITH: MBland Span 0110 10/26/2019 T.  TYSOR (NOTE) SARS-CoV-2 target nucleic acids are DETECTED. The SARS-CoV-2 RNA is generally detectable in upper and lower respiratory specimens during the acute phase of infection. Positive results are indicative of the presence of SARS-CoV-2 RNA. Clinical correlation with patient history and other diagnostic information is  necessary to determine patient infection status. Positive results do not rule out bacterial infection or co-infection with other viruses.  The expected result is Negative. Fact Sheet for Patients: SugarRoll.be Fact Sheet for Healthcare Providers: https://www.woods-mathews.com/ This test is not yet approved or cleared by the [place] FDA and  has been authorized for detection and/or diagnosis of SARS-CoV-2 by FDA under an Emergency Use Authorization (EUA). This EUA will remain  in effect (meaning this test can be used) for t he duration of the COVID-19 declaration under Section 564(b)(1) of the Act, 21 U.S.C. section 360bbb-3(b)(1), unless the authorization is terminated or revoked sooner. Performed at [facility], [place] [address], [place], [place] [address]   MRSA PCR Screening     Status: None   Collection Time: 10/25/19  7:08 PM   Specimen: Nasal Mucosa; Nasopharyngeal  Result Value Ref Range Status   MRSA by PCR NEGATIVE NEGATIVE Final    Comment:        The GeneXpert MRSA Assay (FDA approved for NASAL specimens only), is one component of a comprehensive MRSA colonization surveillance program. It is not intended to diagnose MRSA infection nor to guide or monitor treatment for MRSA infections. Performed at [facility], [place] [address], [place], [place] [address]      Medications:   . Chlorhexidine Gluconate Cloth  6 each Topical Daily  . cholecalciferol  5,000 Units Oral Daily  . dorzolamide-timolol  1 drop Both Eyes BID  . free water  400 mL Per Tube Q8H  . heparin  7,500  Units Subcutaneous Q8H  . latanoprost  1 drop Both Eyes  QHS  . mouth rinse  15 mL Mouth Rinse BID  . QUEtiapine  12.5 mg Oral Daily  . QUEtiapine  50 mg Oral Daily  . tamsulosin  0.4 mg Oral QPC supper  . traZODone  50 mg Oral QHS   Continuous Infusions: . dextrose 75 mL/hr at 10/29/19 0143     LOS: 4 days   Charlynne Cousins  [facility]  10/29/2019, 7:46 AM

## 2019-10-29 NOTE — Progress Notes (Signed)
1800- Update given to daughter. Multiple attempts to face time did not work. Time allowed for questions.

## 2019-10-29 NOTE — TOC Initial Note (Signed)
Transition of Care [facility]) - Initial/Assessment Note    Patient Details  Name: Randy Chandler MRN: [id number] Date of Birth: 1930/10/15  Transition of Care (TOC) CM/SW Contact:    Joaquin Courts, RN Phone Number: 10/29/2019, 1:09 PM  Clinical Narrative:                 CM noted Pt recommendation for SNF. CM spoke with patient's daughter and discussed recommendation. Daughter is agreeable to SNF placement for short term rehab. CM discussed placement process and with permission faxed FL2 to area facilities. Will follow and present bed offers once available.   Expected Discharge Plan: Skilled Nursing Facility Barriers to Discharge: Continued Medical Work up   Patient Goals and CMS Choice Patient states their goals for this hospitalization and ongoing recovery are:: to go to a rehab facility per his daughter CMS Medicare.gov Compare Post Acute Care list provided to:: Patient Represenative (must comment) Choice offered to / list presented to : Adult Children  Expected Discharge Plan and Services Expected Discharge Plan: [place]   Discharge Planning Services: CM Consult Post Acute Care Choice: [place] Living arrangements for the past 2 months: Assisted Living Facility([facility])                 DME Arranged: N/A DME Agency: NA       HH Arranged: NA [place] Agency: NA        Prior Living Arrangements/Services Living arrangements for the past 2 months: Assisted Living Facility([facility]) Lives with:: Facility Resident Patient language and need for interpreter reviewed:: Yes        Need for Family Participation in Patient Care: Yes (Comment) Care giver support system in place?: Yes (comment)   Criminal Activity/Legal Involvement Pertinent to Current Situation/Hospitalization: No - Comment as needed  Activities of Daily Living Home Assistive Devices/Equipment: None ADL Screening (condition at time of admission) Patient's cognitive  ability adequate to safely complete daily activities?: No Is the patient deaf or have difficulty hearing?: No Does the patient have difficulty seeing, even when wearing glasses/contacts?: No Does the patient have difficulty concentrating, remembering, or making decisions?: Yes Patient able to express need for assistance with ADLs?: No Does the patient have difficulty dressing or bathing?: Yes Independently performs ADLs?: No Communication: Appropriate for developmental age(can verbalize needs) Dressing (OT): Dependent Is this a change from baseline?: Pre-admission baseline Grooming: Dependent Is this a change from baseline?: Pre-admission baseline Does the patient have difficulty walking or climbing stairs?: Yes Weakness of Legs: Both Weakness of Arms/Hands: Both  Permission Sought/Granted                  Emotional Assessment   Attitude/Demeanor/Rapport: Engaged Affect (typically observed): Accepting Orientation: : Fluctuating Orientation (Suspected and/or reported Sundowners)   Psych Involvement: No (comment)  Admission diagnosis:  Dehydration [E86.0] Hypernatremia [E87.0] Hyponatremia [E87.1] AKI (acute kidney injury) ([place]) [N17.9] [id number] [U07.1] Patient Active Problem List   Diagnosis Date Noted  . Hypernatremia 10/25/2019  . Dehydration 10/25/2019  . Failure to thrive in adult 10/25/2019  . Generalized weakness 10/25/2019  . Acute metabolic encephalopathy [id number]  . COVID-19 virus infection 10/25/2019  . AKI (acute kidney injury) ([place]) 10/25/2019   PCP:  Terressa Koyanagi, MD Pharmacy:   [facility] [place], [address] AT [address] [place] [address] Phone: [phone] Fax: [phone]     Social Determinants of Health (SDOH) Interventions  Readmission Risk Interventions No flowsheet data found.

## 2019-10-29 NOTE — NC FL2 (Addendum)
[place] LEVEL OF CARE SCREENING TOOL     IDENTIFICATION  Patient Name: Randy Chandler Birthdate: 1930/10/29 Sex: male Admission Date (Current Location): 10/25/2019  [facility] and [place] Number:  Herbalist and Address:  [facility] [place], [place], [place] [address]([facility])      Provider Number: [id number]  Attending Physician Name and Address:  Charlynne Cousins, MD  Relative Name and Phone Number:       Current Level of Care: Hospital Recommended Level of Care: [place] Prior Approval Number:    Date Approved/Denied:   PASRR Number: [id number]  Discharge Plan: SNF    Current Diagnoses: Patient Active Problem List   Diagnosis Date Noted  . Hypernatremia 10/25/2019  . Dehydration 10/25/2019  . Failure to thrive in adult 10/25/2019  . Generalized weakness 10/25/2019  . Acute metabolic encephalopathy A999333  . COVID-19 virus infection 10/25/2019  . AKI (acute kidney injury) ([place]) 10/25/2019    Orientation RESPIRATION BLADDER Height & Weight     (disoriented X4)  Normal Incontinent Weight: (S) 51.9 kg(IV pump removed from bed) Height:  5\' 6"  (167.6 cm)  BEHAVIORAL SYMPTOMS/MOOD NEUROLOGICAL BOWEL NUTRITION STATUS      Incontinent Diet(dysphagia 1)  AMBULATORY STATUS COMMUNICATION OF NEEDS Skin   Extensive Assist Verbally Normal                       Personal Care Assistance Level of Assistance  Bathing, Feeding, Dressing, Total care Bathing Assistance: Maximum assistance Feeding assistance: Limited assistance Dressing Assistance: Maximum assistance Total Care Assistance: Maximum assistance   Functional Limitations Info             SPECIAL CARE FACTORS FREQUENCY                       Contractures Contractures Info: Not present    Additional Factors Info  Code Status, Allergies Code Status Info: DNR Allergies Info: Hytrin           Current  Medications (10/29/2019):  This is the current hospital active medication list Current Facility-Administered Medications  Medication Dose Route Frequency Provider Last Rate Last Admin  . acetaminophen (TYLENOL) tablet 650 mg  650 mg Oral Q6H PRN Raiford Noble Latif, DO       Or  . acetaminophen (TYLENOL) suppository 650 mg  650 mg Rectal Q6H PRN Sheikh, Omair Latif, DO      . albuterol (VENTOLIN HFA) 108 (90 Base) MCG/ACT inhaler 2 puff  2 puff Inhalation Q4H PRN Raiford Noble [place], DO      . Chlorhexidine Gluconate Cloth 2 % PADS 6 each  6 each Topical Daily Raiford Noble [place], [place]   6 each at 10/29/19 1143  . cholecalciferol (VITAMIN D) tablet 5,000 Units  5,000 Units Oral Daily Raiford Noble  [place], [place]   5,000 Units at 10/29/19 [id number]  . dorzolamide-timolol (COSOPT) 22.3-6.8 MG/ML ophthalmic solution 1 drop  1 drop Both Eyes BID Raiford Noble [place], DO   1 drop at 10/29/19 [id number]  . free water 400 mL  400 mL Per Tube Q8H Adhikari, Amrit, MD      . heparin injection 7,500 Units  7,500 Units Subcutaneous Q8H Blount, Xenia T, NP   7,500 Units at 10/29/19 0554  . labetalol (NORMODYNE) injection 10 mg  10 mg Intravenous Q2H PRN Adhikari, Amrit, MD      . latanoprost (XALATAN) 0.005 % ophthalmic solution  1 drop  1 drop Both Eyes QHS Raiford Noble [place], [place]   1 drop at 10/28/19 2216  . MEDLINE mouth rinse  15 mL Mouth Rinse BID Shelly Coss, MD   15 mL at 10/29/19 0903  . ondansetron (ZOFRAN) tablet 4 mg  4 mg Oral Q6H PRN Raiford Noble Latif, DO       Or  . ondansetron [facility]) injection 4 mg  4 mg Intravenous Q6H PRN Sheikh, Omair Latif, DO      . QUEtiapine (SEROQUEL) tablet 12.5 mg  12.5 mg Oral Daily Sheikh, Georgina Quint Latif, DO   12.5 mg at 10/29/19 0900  . QUEtiapine (SEROQUEL) tablet 50 mg  50 mg Oral Daily Sheikh, Georgina Quint [place], DO      . tamsulosin [facility]) capsule 0.4 mg  0.4 mg Oral QPC supper Sheikh, Georgina Quint Latif, DO      . traMADol [facility]) tablet 50 mg  50 mg Oral BID PRN Raiford Noble Latif,  DO      . traZODone (DESYREL) tablet 50 mg  50 mg Oral QHS Sheikh, Georgina Quint [place], DO   50 mg at 10/28/19 2215     Discharge Medications: Please see discharge summary for a list of discharge medications.  Relevant Imaging Results:  Relevant Lab Results:   Additional Information SSN [id number]  Joaquin Courts, RN

## 2019-10-30 LAB — COMPREHENSIVE METABOLIC PANEL
ALT: 46 U/L — ABNORMAL HIGH (ref 0–44)
AST: 36 U/L (ref 15–41)
Albumin: 3 g/dL — ABNORMAL LOW (ref 3.5–5.0)
Alkaline Phosphatase: 62 U/L (ref 38–126)
Anion gap: 10 (ref 5–15)
BUN: 17 mg/dL (ref 8–23)
CO2: 27 mmol/L (ref 22–32)
Calcium: 9.1 mg/dL (ref 8.9–10.3)
Chloride: 105 mmol/L (ref 98–111)
Creatinine, Ser: 0.93 mg/dL (ref 0.61–1.24)
GFR calc Af Amer: >60 mL/min (ref >60)
GFR calc non Af Amer: >60 mL/min (ref >60)
Glucose, Bld: 105 mg/dL — ABNORMAL HIGH (ref 70–99)
Potassium: 3.8 mmol/L (ref 3.5–5.1)
Sodium: 142 mmol/L (ref 135–145)
Total Bilirubin: 0.4 mg/dL (ref 0.3–1.2)
Total Protein: 7.8 g/dL (ref 6.5–8.1)

## 2019-10-30 LAB — CBC WITH DIFFERENTIAL/PLATELET
Abs Immature Granulocytes: 0.11 10*3/uL — ABNORMAL HIGH (ref 0.00–0.07)
Basophils Absolute: 0 10*3/uL (ref 0.0–0.1)
Basophils Relative: 0 %
Eosinophils Absolute: 0.1 10*3/uL (ref 0.0–0.5)
Eosinophils Relative: 1 %
HCT: 39.3 % (ref 39.0–52.0)
Hemoglobin: 12.8 g/dL — ABNORMAL LOW (ref 13.0–17.0)
Immature Granulocytes: 1 %
Lymphocytes Relative: 11 %
Lymphs Abs: 1 10*3/uL (ref 0.7–4.0)
MCH: 30.2 pg (ref 26.0–34.0)
MCHC: 32.6 g/dL (ref 30.0–36.0)
MCV: 92.7 fL (ref 80.0–100.0)
Monocytes Absolute: 0.8 10*3/uL (ref 0.1–1.0)
Monocytes Relative: 9 %
Neutro Abs: 6.7 10*3/uL (ref 1.7–7.7)
Neutrophils Relative %: 78 %
Platelets: 279 10*3/uL (ref 150–400)
RBC: 4.24 MIL/uL (ref 4.22–5.81)
RDW: 12.7 % (ref 11.5–15.5)
WBC: 8.7 10*3/uL (ref 4.0–10.5)
nRBC: 0 % (ref 0.0–0.2)

## 2019-10-30 LAB — GLUCOSE, CAPILLARY
Glucose-Capillary: 55 mg/dL — ABNORMAL LOW (ref 70–99)
Glucose-Capillary: 78 mg/dL (ref 70–99)
Glucose-Capillary: 81 mg/dL (ref 70–99)
Glucose-Capillary: 193 mg/dL — ABNORMAL HIGH (ref 70–99)
Glucose-Capillary: 93 mg/dL (ref 70–99)
Glucose-Capillary: 55 mg/dL — ABNORMAL LOW (ref 70–99)
Glucose-Capillary: 72 mg/dL (ref 70–99)

## 2019-10-30 LAB — FERRITIN: Ferritin: 665 ng/mL — ABNORMAL HIGH (ref 24–336)

## 2019-10-30 LAB — MAGNESIUM: Magnesium: 1.9 mg/dL (ref 1.7–2.4)

## 2019-10-30 LAB — D-DIMER, QUANTITATIVE: D-Dimer, Quant: 2.35 ug/mL-FEU — ABNORMAL HIGH (ref 0.00–0.50)

## 2019-10-30 LAB — C-REACTIVE PROTEIN: CRP: 2.7 mg/dL — ABNORMAL HIGH (ref <1.0)

## 2019-10-30 LAB — PHOSPHORUS: Phosphorus: 3.2 mg/dL (ref 2.5–4.6)

## 2019-10-30 MED ORDER — QUETIAPINE FUMARATE 25 MG PO TABS: 12.5000 mg | ORAL_TABLET | Freq: Two times a day (BID) | ORAL | Status: DC

## 2019-10-30 MED ORDER — DEXTROSE 50 % IV SOLN: 12.5000 g | INTRAVENOUS | Status: DC

## 2019-10-30 MED ORDER — JEVITY 1.2 CAL PO LIQD
1000.0000 mL | ORAL | Status: DC
  Administered 2019-10-30: 1000 mL
  Filled 2019-10-30 (×2): qty 1000

## 2019-10-30 MED ORDER — ENSURE ENLIVE PO LIQD
237.0000 mL | Freq: Two times a day (BID) | ORAL | Status: DC
  Administered 2019-10-31 – 2019-11-05 (×10): 237 mL via ORAL

## 2019-10-30 MED ORDER — FREE WATER
200.0000 mL | Status: DC
  Administered 2019-10-30 – 2019-11-02 (×18): 200 mL

## 2019-10-30 MED ORDER — DEXTROSE 50 % IV SOLN
25.0000 g | INTRAVENOUS | Status: AC
  Administered 2019-10-30: 25 g via INTRAVENOUS
  Filled 2019-10-30: qty 50

## 2019-10-30 MED ORDER — ENSURE ENLIVE PO LIQD: 237.0000 mL | Freq: Two times a day (BID) | ORAL | Status: DC

## 2019-10-30 MED ORDER — QUETIAPINE FUMARATE 25 MG PO TABS
12.5000 mg | ORAL_TABLET | Freq: Two times a day (BID) | ORAL | Status: DC
  Administered 2019-10-30 – 2019-10-31 (×2): 12.5 mg via ORAL
  Filled 2019-10-30 (×2): qty 1

## 2019-10-30 MED ORDER — QUETIAPINE FUMARATE 25 MG PO TABS: 12.5000 mg | ORAL_TABLET | Freq: Every day | ORAL | Status: DC

## 2019-10-30 MED ORDER — QUETIAPINE FUMARATE 25 MG PO TABS: 25.0000 mg | ORAL_TABLET | Freq: Every day | ORAL | Status: DC

## 2019-10-30 MED ORDER — DEXTROSE 10 % IV SOLN: INTRAVENOUS | Status: DC

## 2019-10-30 MED ORDER — TRAZODONE HCL 50 MG PO TABS
50.0000 mg | ORAL_TABLET | Freq: Every evening | ORAL | Status: DC | PRN
  Administered 2019-11-02: 22:00:00 50 mg via ORAL
  Filled 2019-10-30 (×2): qty 1

## 2019-10-30 MED ORDER — OSMOLITE 1.2 CAL PO LIQD
1000.0000 mL | ORAL | Status: DC
  Administered 2019-10-30: 1000 mL
  Filled 2019-10-30 (×2): qty 1000

## 2019-10-30 MED ORDER — PRO-STAT SUGAR FREE PO LIQD
30.0000 mL | Freq: Every day | ORAL | Status: DC
  Administered 2019-10-30 – 2019-10-31 (×2): 30 mL
  Filled 2019-10-30: qty 30

## 2019-10-30 NOTE — Progress Notes (Signed)
  Speech Language Pathology Treatment: Dysphagia  Patient Details Name: Randy Chandler MRN: [id number] DOB: 1930-08-13 Today's Date: 10/30/2019 Time: [id number] SLP Time Calculation (min) (ACUTE ONLY): 30 min  Assessment / Plan / Recommendation Clinical Impression  Pt is lethargic again this afternoon after having received Seroquel. RN reports improved intake this morning at breakfast, albeit slow. This afternoon pt has a moderate amount of R buccal pocketing. SLP provided Max A to move the bolus more medially, with liquid wash then provided to facilitate clearance. After several repetitions, there was only mild pocketing remaining that needed to be removed via swab. Continue to suspect a primary cognitive-based dysphagia impacting oral preparation and transit - less so a pharyngeal dysphagia. [name] difficulties are exacerbated in the setting of acute illness and perhaps medication-related changes in alertness. Recommend continuing current diet but only when fully alert and accepting. Discussed with RN who also reports that cortrak has been ordered and that MD plans to adjust Seroquel to be given only at night. Will continue to follow to see how this may impact his swallowing function and safety.    HPI HPI: Pt is an 85 yo male, recently tested positive for COVID-19, who presents with AMS and dehydration. CXR on admission without active disease. PMH: non-Hodgkin's B-cell lymphoma, dementia, HTN, HLD      SLP Plan  Continue with current plan of care       Recommendations  Diet recommendations: Dysphagia 1 (puree);Thin liquid Liquids provided via: Cup;Straw Medication Administration: Crushed with puree Supervision: Staff to assist with self feeding;Full supervision/cueing for compensatory strategies Compensations: Minimize environmental distractions;Slow rate;Small sips/bites;Follow solids with liquid Postural Changes and/or Swallow Maneuvers: Seated upright 90 degrees                Oral  Care Recommendations: Oral care QID Follow up Recommendations: Skilled Nursing facility SLP Visit Diagnosis: Dysphagia, oral phase (R13.11) Plan: Continue with current plan of care       GO                10/30/2019, 2:54 PM  Randy Chandler., M.A. [place] Acute Environmental education officer [phone] Office [phone]

## 2019-10-30 NOTE — Progress Notes (Signed)
Initial Nutrition Assessment  DOCUMENTATION CODES:   Underweight  INTERVENTION:  -Once Cortrak placed, initiate Osmolite 1.2 @ 25 ml hr, advance as tolerated 10 ml every 8 hrs to goal rate 45 ml/hr (1080 ml/day) -Prostat 30 ml daily via tube  Tube feed regimen provides 1396 kcal, 75 grams of protein, and 886 ml free water   -Continue Ensure Enlive po BID, each supplement provides 350 kcal and 20 grams of protein  -Encourage po intake   NUTRITION DIAGNOSIS:   Inadequate oral intake related to acute illness(Covid-19 infection) as evidenced by energy intake < or equal to 50% for > or equal to 5 days(per chart review, facility reporting ongoing poor oral intake s/p infection; significant dehydration present on admission).  GOAL:   Patient will meet greater than or equal to 90% of their needs  MONITOR:   PO intake, Labs, I & O's, Weight trends, TF tolerance  REASON FOR ASSESSMENT:   Consult Enteral/tube feeding initiation and management  ASSESSMENT:  RD working remotely.  85 year old male with medical history significant for non-Hodgkin's B cell lymphoma, HTN, HLD, PBH, CAD, osteoarthritis, who presented from facility with AMS and dehydration s/p recently testing positive for COVID -19. Patient admitted for significant dehydration and failure to thrive in the setting of COVID-19 infection.  Patient with acute metabolic encephalopathy super imposed on advanced dementia; likely secondary to AKI, viral infection and hyponatremia.  Per notes, MD had a conversation with patient's daughter who is a physician on 12/21 regarding goals of care. Patient with poor prognosis; minimal oral intake of fluids or food and remains nonverbal. If pt does not improve over the next 1-2 days, daughter will consider comfort care.   12/22 SLP recommendations: dysphagia 1 with full supervision. Patient consuming 10-75% of last 3 recorded meals.   Medications reviewed and include: Vitamin D3, Ensure  BID, Heparin 7500 units every 8 hrs D10 @ 75 ml/hr  Labs: CBGs 78-193 x 24 hrs  NUTRITION - FOCUSED PHYSICAL EXAM: Unable to complete at this time, RD working remotely.   Diet Order:   Diet Order            DIET - DYS 1 Room service appropriate? Yes; Fluid consistency: Thin  Diet effective now              EDUCATION NEEDS:   No education needs have been identified at this time  Skin:  Skin Assessment: Reviewed RN Assessment  Last BM:  12/22  Height:   Ht Readings from Last 1 Encounters:  10/25/19 5\' 6"  (1.676 m)    Weight:   Wt Readings from Last 1 Encounters:  10/27/19 (S) 51.9 kg    Ideal Body Weight:  64.5 kg  BMI:  Body mass index is 18.47 kg/m.  Estimated Nutritional Needs:   Kcal:  1375-1600  Protein:  73-80  Fluid:  >/= 1.3 L/day   Lajuan Lines, RD, LDN Clinical Nutrition [place] Telephone [phone] After Hours/Weekend Pager: [phone]

## 2019-10-30 NOTE — Progress Notes (Signed)
PROGRESS NOTE    Jonerik Cull  [id number] DOB: [birth year] DOA: 10/25/2019 PCP: Terressa Koyanagi, MD    Brief Narrative:  85 year old gentleman with history of non-Hodgkin's B-cell lymphoma, dementia, hypertension and hyperlipidemia who presented to the hospital with altered mental status and dehydration.  He was recently tested positive for COVID-19, found to have worsening confusion from assisted living facility.  In the emergency room sodium was 160 with acute kidney injury.  Chest x-ray showed bilateral infiltrate.  He was on room air since admission.   Assessment & Plan:   Active Problems:   Hypernatremia   Dehydration   Failure to thrive in adult   Generalized weakness   Acute metabolic encephalopathy   COVID-19 virus infection   AKI (acute kidney injury) ([place])  Acute metabolic encephalopathy in the setting of underlying advanced dementia due to hypernatremia and dehydration along with COVID-19 infection. No focal deficits since presentation.  Patient has some clinical improvement, however oral intake remains poor. Correcting electrolytes. No adequate intake, hypoglycemic requiring continuous dextrose infusion. Discussed with both of the patient's daughter, will insert core track feeding tube and start enteral feeding with calorie and water and give him some time to recover. Continue maintenance dextrose to avoid hypoglycemia for now.  Hypovolemic hypernatremia: Sodium levels improved on dextrose infusion.  Will encourage oral intake, however may not have adequate intake, starting tube feeding.  COVID-19 viral infection: Inflammatory biomarkers are improving.  He is mostly on room air.  Since he had adequate improvement, he was given 2 days of IV steroids and then stopped.  Acute kidney injury: Improved with fluid.  Persistent hypoglycemia: Due to inadequate oral intake and poor glycemic reserve.  Continue dextrose infusion and encourage oral feeding.  Advanced  dementia/debility: Lives at assisted living.  Patient is on Seroquel and currently dehydration.  We will try to taper Seroquel as his behavior is well controlled and matter-of-fact he is less interactive. Patient will need placement to a skilled nursing facility before going back to assisted living.  Continue to work with PT OT. Will need to establish oral intake before discharge.  DVT prophylaxis: Lovenox subcu Code Status: DNR Family Communication: Patient's 2 daughters, Ms. Creta Levin and Dr. Mikeal Hawthorne Disposition Plan: SNF after clinical improvement and establishment of oral intake.   Consultants:   None  Procedures:   None  Antimicrobials:  Anti-infectives (From admission, onward)   None         Subjective: Patient seen and examined.  He just nodded yes and no to my questions.  He will not open his eyes.  Otherwise mostly remains on room air, afebrile.  No good oral intake.  Nursing reported about 1 serving of applesauce all day yesterday. Noted hypoglycemic events and currently on 10% dextrose.  Objective: Vitals:   10/29/19 1650 10/29/19 2010 10/30/19 0741 10/30/19 1235  BP: (!) 143/89 (!) 143/85 113/72 (!) 147/90  Pulse: 83 98  92  Resp: 18 18 14    Temp: 98.8 F (37.1 C) 97.8 F (36.6 C) 97.7 F (36.5 C) (!) 97.5 F (36.4 C)  TempSrc: Axillary Axillary Axillary Axillary  SpO2: 100% 100%  100%  Weight:      Height:        Intake/Output Summary (Last 24 hours) at 10/30/2019 1435 Last data filed at 10/30/2019 0930 Gross per 24 hour  Intake 418 ml  Output 301 ml  Net 117 ml   Filed Weights   10/25/19 1859 10/26/19 0600 10/27/19 0500  Weight: 46.3 kg 46.6  kg (S) 51.9 kg    Examination:  General exam: Appears calm and comfortable , on room air.  Not in any distress.  Not interacting much. Respiratory system: Clear to auscultation. Respiratory effort normal.  No added sounds. Cardiovascular system: S1 & S2 heard, RRR. Gastrointestinal system: Abdomen is  nondistended, soft and nontender. No organomegaly or masses felt. Normal bowel sounds heard. Central nervous system: Alert but not oriented. Extremities:  Moves all extremities. Skin: No rashes, lesions or ulcers Psychiatry: Judgement and insight appear compromised.   Mood & affect flat.    Data Reviewed: I have personally reviewed following labs and imaging studies  CBC: Recent Labs  Lab 10/26/19 0443 10/26/19 2020 10/27/19 0606 10/28/19 0538 10/29/19 0554 10/30/19 0730  WBC 10.3 11.3* 11.2* 11.9* 7.6 8.7  NEUTROABS 9.4*  --  10.2* 10.1* 6.1 6.7  HGB 12.6* 12.7* 12.8* 13.4 13.1 12.8*  HCT 43.4 42.2 43.4 43.4 41.5 39.3  MCV 102.1* 99.5 99.5 97.1 93.9 92.7  PLT 255 244 241 202 285 [id number]   Basic Metabolic Panel: Recent Labs  Lab 10/26/19 0443 10/26/19 1225 10/27/19 0606 10/28/19 0538 10/29/19 0554 10/30/19 0730  NA 162* 159* 154* 146* 143 142  K 4.9 4.3 4.5 4.5 3.5 3.8  CL 125* 121* 119* 113* 105 105  CO2 27 28 27  19* 26 27  GLUCOSE 162* 166* 158* 133* 97 105*  BUN 48* 44* 37* 27* 17 17  CREATININE 1.27* 1.26* 1.15 1.04 0.91 0.93  CALCIUM 9.6 9.6 9.6 9.4 9.4 9.1  MG 2.8*  --  2.3 2.0 2.0 1.9  PHOS 3.2  --  3.3 2.9 3.4 3.2   GFR: Estimated Creatinine Clearance: 41.1 mL/min (by C-G formula based on SCr of 0.93 mg/dL). Liver Function Tests: Recent Labs  Lab 10/26/19 0443 10/27/19 0606 10/28/19 0538 10/29/19 0554 10/30/19 0730  AST 38 34 42* 42* 36  ALT 29 28 31  43 46*  ALKPHOS 55 57 70 59 62  BILITOT 0.4 0.7 0.8 1.0 0.4  PROT 8.5* 8.1 7.8 7.9 7.8  ALBUMIN 3.3* 3.2* 3.1* 3.3* 3.0*   Recent Labs  Lab 10/25/19 1246  LIPASE 36   No results for input(s): AMMONIA in the last 168 hours. Coagulation Profile: No results for input(s): INR, PROTIME in the last 168 hours. Cardiac Enzymes: Recent Labs  Lab 10/25/19 1246  CKTOTAL 408*   BNP (last 3 results) No results for input(s): PROBNP in the last 8760 hours. HbA1C: No results for input(s): HGBA1C in the  last 72 hours. CBG: Recent Labs  Lab 10/29/19 2352 10/30/19 0014 10/30/19 0304 10/30/19 0744 10/30/19 1238  GLUCAP 55* 193* 78 81 93   Lipid Profile: No results for input(s): CHOL, HDL, LDLCALC, TRIG, CHOLHDL, LDLDIRECT in the last 72 hours. Thyroid Function Tests: No results for input(s): TSH, T4TOTAL, FREET4, T3FREE, THYROIDAB in the last 72 hours. Anemia Panel: Recent Labs    10/29/19 0554 10/30/19 0730  FERRITIN 889* 665*   Sepsis Labs: Recent Labs  Lab 10/25/19 1246 10/25/19 1658 10/25/19 1740 10/26/19 0443 10/27/19 0606  PROCALCITON  --  0.19  --  0.10 0.10  LATICACIDVEN 2.9*  --  2.1*  --  2.4*    Recent Results (from the past 240 hour(s))  Urine culture     Status: None   Collection Time: 10/25/19  3:00 PM   Specimen: Urine, Random  Result Value Ref Range Status   Specimen Description   Final    URINE, RANDOM Performed at [facility], [place] [address] [place], [place] [address]    Special Requests   Final    NONE Performed at [facility], [place] [address] [place], [place] [address]    Culture   Final    NO GROWTH Performed at [facility], [place] [address] [place], [place] [address]    Report Status 10/26/2019 FINAL  Final  SARS CORONAVIRUS 2 (TAT 6-24 HRS) Nasopharyngeal Nasopharyngeal Swab     Status: Abnormal   Collection Time: 10/25/19  4:58 PM   Specimen: Nasopharyngeal Swab  Result Value Ref Range Status   SARS Coronavirus 2 POSITIVE (A) NEGATIVE Final    Comment: RESULT CALLED TO, READ BACK BY AND VERIFIED WITH: MBland Span 0110 10/26/2019 T. TYSOR (NOTE) SARS-CoV-2 target nucleic acids are DETECTED. The SARS-CoV-2 RNA is generally detectable in upper and lower respiratory specimens during the acute phase of infection. Positive results are indicative of the presence of SARS-CoV-2 RNA. Clinical correlation with patient history and other diagnostic information is  necessary to determine  patient infection status. Positive results do not rule out bacterial infection or co-infection with other viruses.  The expected result is Negative. Fact Sheet for Patients: SugarRoll.be Fact Sheet for Healthcare Providers: https://www.woods-mathews.com/ This test is not yet approved or cleared by the [place] FDA and  has been authorized for detection and/or diagnosis of SARS-CoV-2 by FDA under an Emergency Use Authorization (EUA). This EUA will remain  in effect (meaning this test can be used) for t he duration of the COVID-19 declaration under Section 564(b)(1) of the Act, 21 U.S.C. section 360bbb-3(b)(1), unless the authorization is terminated or revoked sooner. Performed at [facility], [place] [address], [place], [place] [address]   MRSA PCR Screening     Status: None   Collection Time: 10/25/19  7:08 PM   Specimen: Nasal Mucosa; Nasopharyngeal  Result Value Ref Range Status   MRSA by PCR NEGATIVE NEGATIVE Final    Comment:        The GeneXpert MRSA Assay (FDA approved for NASAL specimens only), is one component of a comprehensive MRSA colonization surveillance program. It is not intended to diagnose MRSA infection nor to guide or monitor treatment for MRSA infections. Performed at [facility], [place] [address], [place], [place] [address]          Radiology Studies: No results found.      Scheduled Meds: . Chlorhexidine Gluconate Cloth  6 each Topical Daily  . cholecalciferol  5,000 Units Oral Daily  . dorzolamide-timolol  1 drop Both Eyes BID  . feeding supplement (ENSURE ENLIVE)  237 mL Oral BID BM  . free water  400 mL Per Tube Q8H  . heparin  7,500 Units Subcutaneous Q8H  . latanoprost  1 drop Both Eyes QHS  . mouth rinse  15 mL Mouth Rinse BID  . QUEtiapine  12.5 mg Oral Daily  . QUEtiapine  25 mg Oral Daily  . tamsulosin  0.4 mg Oral QPC supper   Continuous Infusions: . dextrose  75 mL/hr at 10/30/19 0510     LOS: 5 days    Time spent: 30 minutes    Barb Merino, MD [facility] Pager [phone]

## 2019-10-30 NOTE — Progress Notes (Signed)
CBG:55 @00 :00 gave one amp of D5 CBG: 00:16 193 Notified MD see orders in [facility]

## 2019-10-30 NOTE — Procedures (Signed)
Cortrak  Person Inserting Tube:  Owin Vignola C, RD Tube Type:  Cortrak - 43 inches Tube Location:  Left nare Initial Placement:  Stomach Secured by: Bridle Technique Used to Measure Tube Placement:  Documented cm marking at nare/ corner of mouth Cortrak Secured At:  69 cm    Cortrak Tube Team Note:  Consult received to place a Cortrak feeding tube.   No x-ray is required. RN may begin using tube.   If the tube becomes dislodged please keep the tube and contact the Cortrak team at [email or web address] (password [email or web address]) for replacement.  If after hours and replacement cannot be delayed, place a NG tube and confirm placement with an abdominal x-ray.    Ama Mcmaster RD, LDN, CNSC [phone] Pager [phone] After Hours Pager   

## 2019-10-31 ENCOUNTER — Inpatient Hospital Stay (HOSPITAL_COMMUNITY): Payer: Medicare Other

## 2019-10-31 LAB — BASIC METABOLIC PANEL
Anion gap: 8 (ref 5–15)
BUN: 16 mg/dL (ref 8–23)
CO2: 30 mmol/L (ref 22–32)
Calcium: 9.4 mg/dL (ref 8.9–10.3)
Chloride: 102 mmol/L (ref 98–111)
Creatinine, Ser: 0.87 mg/dL (ref 0.61–1.24)
GFR calc Af Amer: >60 mL/min (ref >60)
GFR calc non Af Amer: >60 mL/min (ref >60)
Glucose, Bld: 96 mg/dL (ref 70–99)
Potassium: 3.5 mmol/L (ref 3.5–5.1)
Sodium: 140 mmol/L (ref 135–145)

## 2019-10-31 LAB — GLUCOSE, CAPILLARY
Glucose-Capillary: 131 mg/dL — ABNORMAL HIGH (ref 70–99)
Glucose-Capillary: 37 mg/dL — CL (ref 70–99)
Glucose-Capillary: 52 mg/dL — ABNORMAL LOW (ref 70–99)
Glucose-Capillary: 53 mg/dL — ABNORMAL LOW (ref 70–99)
Glucose-Capillary: 72 mg/dL (ref 70–99)
Glucose-Capillary: 85 mg/dL (ref 70–99)

## 2019-10-31 LAB — CORTISOL-AM, BLOOD: Cortisol - AM: 16.1 ug/dL (ref 6.7–22.6)

## 2019-10-31 LAB — GLUCOSE, RANDOM: Glucose, Bld: 96 mg/dL (ref 70–99)

## 2019-10-31 MED ORDER — OSMOLITE 1.2 CAL PO LIQD
1000.0000 mL | ORAL | Status: DC
  Filled 2019-10-31 (×2): qty 1000

## 2019-10-31 MED ORDER — OSMOLITE 1.2 CAL PO LIQD
1000.0000 mL | ORAL | Status: DC
  Administered 2019-10-31 (×2): 1000 mL
  Filled 2019-10-31 (×3): qty 1000

## 2019-10-31 MED ORDER — QUETIAPINE FUMARATE 25 MG PO TABS
12.5000 mg | ORAL_TABLET | Freq: Every day | ORAL | Status: DC
  Administered 2019-11-01: 21:00:00 12.5 mg via ORAL
  Filled 2019-10-31: qty 1

## 2019-10-31 NOTE — Progress Notes (Signed)
PROGRESS NOTE    Randy Chandler  [id number] DOB: 25-Oct-1930 DOA: 10/25/2019 PCP: Terressa Koyanagi, MD    Brief Narrative:  85 year old gentleman with history of non-Hodgkin's B-cell lymphoma, dementia, hypertension and hyperlipidemia who presented to the hospital with altered mental status and dehydration.  He was recently tested positive for COVID-19, found to have worsening confusion from assisted living facility. In the emergency room sodium was 160 with acute kidney injury.  Chest x-ray showed bilateral infiltrate.  He was on room air since admission.   Assessment & Plan:   Active Problems:   Hypernatremia   Dehydration   Failure to thrive in adult   Generalized weakness   Acute metabolic encephalopathy   COVID-19 virus infection   AKI (acute kidney injury) ([place])  Acute metabolic encephalopathy in the setting of underlying advanced dementia due to hypernatremia and dehydration along with COVID-19 infection. No focal deficits since presentation.  Patient had some clinical improvement, however oral intake remains poor. Correcting electrolytes. Patient had inadequate oral intake, core track tube was placed and is started on tube feeding. Still blood sugars are low, increase tube feeding and will stop dextrose infusion. We will check a CT scan of the head to see if any cerebrovascular accident happened to him in the meantime.  Hypovolemic hypernatremia: Sodium levels improved on dextrose infusion and now on free water flush.  COVID-19 viral infection: Inflammatory biomarkers are improving.  He is mostly on room air.  Since he had adequate improvement, he was given 2 days of IV steroids and then stopped.  Acute kidney injury: Improved with fluid.  Persistent hypoglycemia: Due to inadequate oral intake and poor glycemic reserve.  Maximize tube feeding.  Discontinue dextrose.  Will confirm hypoglycemia with serum blood sugars.  Advanced dementia/debility: Lives at assisted living.   With less interaction, will taper off Seroquel.  Avoid all sedatives. Patient will need placement to a skilled nursing facility before going back to assisted living.  Continue to work with PT OT. Will need to establish oral intake before discharge.  DVT prophylaxis: Lovenox subcu Code Status: DNR Family Communication: Patient's daughter Ms. Creta Levin.   Disposition Plan: SNF after clinical improvement and establishment of oral intake.   Consultants:   None  Procedures:   None  Antimicrobials:  Anti-infectives (From admission, onward)   None         Subjective: Patient seen and examined.  No overnight events.  Blood sugars still low normal, however responding to dextrose.  Afebrile overnight.  On room air. Patient nods and tries to follow commands. He was able to tell his daughter's name to me. Oral intake is negligible, he was pocketing food and coughing.  Objective: Vitals:   10/31/19 0215 10/31/19 0418 10/31/19 0500 10/31/19 0729  BP: 132/83 (!) 142/90    Pulse:  82  97  Resp: 18 16    Temp:  (!) 97.2 F (36.2 C)  98.2 F (36.8 C)  TempSrc:  Axillary  Axillary  SpO2:  100%    Weight:   59.2 kg   Height:        Intake/Output Summary (Last 24 hours) at 10/31/2019 1200 Last data filed at 10/31/2019 0500 Gross per 24 hour  Intake 1888.2 ml  Output 700 ml  Net 1188.2 ml   Filed Weights   10/26/19 0600 10/27/19 0500 10/31/19 0500  Weight: 46.6 kg (S) 51.9 kg 59.2 kg    Examination:  General exam: Appears calm and comfortable , on room air.  Not  in any distress.   Respiratory system: Clear to auscultation. Respiratory effort normal.  No added sounds. Cardiovascular system: S1 & S2 heard, RRR. Gastrointestinal system: Abdomen is nondistended, soft and nontender. No organomegaly or masses felt. Normal bowel sounds heard. Central nervous system: Alert on stimulation but not oriented. Extremities:  Moves all extremities. Skin: No rashes, lesions or  ulcers Psychiatry: Judgement and insight appear compromised.   Mood & affect flat.    Data Reviewed: I have personally reviewed following labs and imaging studies  CBC: Recent Labs  Lab 10/26/19 0443 10/26/19 2020 10/27/19 0606 10/28/19 0538 10/29/19 0554 10/30/19 0730  WBC 10.3 11.3* 11.2* 11.9* 7.6 8.7  NEUTROABS 9.4*  --  10.2* 10.1* 6.1 6.7  HGB 12.6* 12.7* 12.8* 13.4 13.1 12.8*  HCT 43.4 42.2 43.4 43.4 41.5 39.3  MCV 102.1* 99.5 99.5 97.1 93.9 92.7  PLT 255 244 241 202 285 [id number]   Basic Metabolic Panel: Recent Labs  Lab 10/26/19 0443 10/27/19 0606 10/28/19 0538 10/29/19 0554 10/30/19 0730 10/31/19 0555  NA 162* 154* 146* 143 142 140  K 4.9 4.5 4.5 3.5 3.8 3.5  CL 125* 119* 113* 105 105 102  CO2 27 27 19* 26 27 30   GLUCOSE 162* 158* 133* 97 105* 96  BUN 48* 37* 27* 17 17 16   CREATININE 1.27* 1.15 1.04 0.91 0.93 0.87  CALCIUM 9.6 9.6 9.4 9.4 9.1 9.4  MG 2.8* 2.3 2.0 2.0 1.9  --   PHOS 3.2 3.3 2.9 3.4 3.2  --    GFR: Estimated Creatinine Clearance: 50.1 mL/min (by C-G formula based on SCr of 0.87 mg/dL). Liver Function Tests: Recent Labs  Lab 10/26/19 0443 10/27/19 0606 10/28/19 0538 10/29/19 0554 10/30/19 0730  AST 38 34 42* 42* 36  ALT 29 28 31  43 46*  ALKPHOS 55 57 70 59 62  BILITOT 0.4 0.7 0.8 1.0 0.4  PROT 8.5* 8.1 7.8 7.9 7.8  ALBUMIN 3.3* 3.2* 3.1* 3.3* 3.0*   Recent Labs  Lab 10/25/19 1246  LIPASE 36   No results for input(s): AMMONIA in the last 168 hours. Coagulation Profile: No results for input(s): INR, PROTIME in the last 168 hours. Cardiac Enzymes: Recent Labs  Lab 10/25/19 1246  CKTOTAL 408*   BNP (last 3 results) No results for input(s): PROBNP in the last 8760 hours. HbA1C: No results for input(s): HGBA1C in the last 72 hours. CBG: Recent Labs  Lab 10/30/19 1712 10/30/19 2134 10/31/19 0050 10/31/19 0510 10/31/19 0727  GLUCAP 55* 72 131* 85 72   Lipid Profile: No results for input(s): CHOL, HDL, LDLCALC, TRIG,  CHOLHDL, LDLDIRECT in the last 72 hours. Thyroid Function Tests: No results for input(s): TSH, T4TOTAL, FREET4, T3FREE, THYROIDAB in the last 72 hours. Anemia Panel: Recent Labs    10/29/19 0554 10/30/19 0730  FERRITIN 889* 665*   Sepsis Labs: Recent Labs  Lab 10/25/19 1246 10/25/19 1658 10/25/19 1740 10/26/19 0443 10/27/19 0606  PROCALCITON  --  0.19  --  0.10 0.10  LATICACIDVEN 2.9*  --  2.1*  --  2.4*    Recent Results (from the past 240 hour(s))  Urine culture     Status: None   Collection Time: 10/25/19  3:00 PM   Specimen: Urine, Random  Result Value Ref Range Status   Specimen Description   Final    URINE, RANDOM Performed at [facility], [place] [address] [place], [place] [address]    Special Requests   Final  NONE Performed at [facility], [place] [address], [place], [place] [address]    Culture   Final    NO GROWTH Performed at [facility], [place] [address], [place], [place] [address]    Report Status 10/26/2019 FINAL  Final  SARS CORONAVIRUS 2 (TAT 6-24 HRS) Nasopharyngeal Nasopharyngeal Swab     Status: Abnormal   Collection Time: 10/25/19  4:58 PM   Specimen: Nasopharyngeal Swab  Result Value Ref Range Status   SARS Coronavirus 2 POSITIVE (A) NEGATIVE Final    Comment: RESULT CALLED TO, READ BACK BY AND VERIFIED WITH: MBland Span 0110 10/26/2019 T. TYSOR (NOTE) SARS-CoV-2 target nucleic acids are DETECTED. The SARS-CoV-2 RNA is generally detectable in upper and lower respiratory specimens during the acute phase of infection. Positive results are indicative of the presence of SARS-CoV-2 RNA. Clinical correlation with patient history and other diagnostic information is  necessary to determine patient infection status. Positive results do not rule out bacterial infection or co-infection with other viruses.  The expected result is Negative. Fact Sheet for  Patients: SugarRoll.be Fact Sheet for Healthcare Providers: https://www.woods-mathews.com/ This test is not yet approved or cleared by the [place] FDA and  has been authorized for detection and/or diagnosis of SARS-CoV-2 by FDA under an Emergency Use Authorization (EUA). This EUA will remain  in effect (meaning this test can be used) for t he duration of the COVID-19 declaration under Section 564(b)(1) of the Act, 21 U.S.C. section 360bbb-3(b)(1), unless the authorization is terminated or revoked sooner. Performed at [facility], [place] [address], [place], [place] [address]   MRSA PCR Screening     Status: None   Collection Time: 10/25/19  7:08 PM   Specimen: Nasal Mucosa; Nasopharyngeal  Result Value Ref Range Status   MRSA by PCR NEGATIVE NEGATIVE Final    Comment:        The GeneXpert MRSA Assay (FDA approved for NASAL specimens only), is one component of a comprehensive MRSA colonization surveillance program. It is not intended to diagnose MRSA infection nor to guide or monitor treatment for MRSA infections. Performed at [facility], [place] [address] [place], [place] [address]          Radiology Studies: No results found.      Scheduled Meds: . Chlorhexidine Gluconate Cloth  6 each Topical Daily  . cholecalciferol  5,000 Units Oral Daily  . dorzolamide-timolol  1 drop Both Eyes BID  . feeding supplement (ENSURE ENLIVE)  237 mL Oral BID BM  . feeding supplement (PRO-STAT SUGAR FREE 64)  30 mL Per Tube Daily  . free water  200 mL Per Tube Q4H  . heparin  7,500 Units Subcutaneous Q8H  . latanoprost  1 drop Both Eyes QHS  . mouth rinse  15 mL Mouth Rinse BID  . [START ON 11/01/2019] QUEtiapine  12.5 mg Oral QHS  . tamsulosin  0.4 mg Oral QPC supper   Continuous Infusions: . feeding supplement (OSMOLITE 1.2 CAL) 35 mL/hr at 10/31/19 0438     LOS: 6 days    Time spent: 30  minutes    Barb Merino, MD [facility] Pager [phone]

## 2019-10-31 NOTE — Progress Notes (Signed)
1100- Updates given to patient's daughter Sharyn Lull. Patient wants another call back to speak to her father  When he is more awake. Patient is sleeping at this time.

## 2019-10-31 NOTE — Progress Notes (Signed)
Paged MD regarding CBG of 37. No order for D10 available. Gave OJ.

## 2019-10-31 NOTE — Progress Notes (Signed)
Nutrition Follow-up  DOCUMENTATION CODES:   Underweight  INTERVENTION:   Tube Feeding: Increasing TF to goal which should assist with CBGs Increase Osmolite 1.2 at 55 ml/hr Provides 1584 kcals, 73 g of protein and 1069 mL of free water Meets 100% calorie needs, 95% protein needs  Continue Ensure Enlive po BID, each supplement provides 350 kcal and 20 grams of protein   NUTRITION DIAGNOSIS:   Inadequate oral intake related to acute illness(Covid-19 infection) as evidenced by energy intake < or equal to 50% for > or equal to 5 days(per chart review, facility reporting ongoing poor oral intake s/p infection; significant dehydration present on admission).  Being addressed via supplement,TF  GOAL:   Patient will meet greater than or equal to 90% of their needs  Progressing   MONITOR:   PO intake, Labs, I & O's, Weight trends, TF tolerance  REASON FOR ASSESSMENT:   Consult Enteral/tube feeding initiation and management  ASSESSMENT:   85 year old male with medical history significant for non-Hodgkin's B cell lymphoma, HTN, HLD, PBH, CAD, osteoarthritis, who presented from facility with AMS and dehydration s/p recently testing positive for COVID -19. Patient admitted for significant dehydration and failure to thrive in the setting of COVID-19 infection.  Received phone call from Pharmacist regarding MD concern about hypoglycemia; MD asking for a "higher carb formula."  Pt started on Osmolite 1.2 which is not a "carb-restricted" formula.   Noted pt started on Osmolite 1.2 at 25 ml/hr at 2050 last night. Initial goal rate of 45 ml/hr. Noted CBG of 55 at 1712 which was before TF were initiated. CBGs since TF initiation 72-131   Noted D10 infusion also discontinued since assessment yesterday  Net + 5L since admission per I/O flow sheet, current wt 59 kg. Admission wt 46 kg  Labs: reviewed; CBGs 55-131 Meds: Off D10 infusion   Diet Order:   Diet Order            DIET -  DYS 1 Room service appropriate? Yes; Fluid consistency: Thin  Diet effective now              EDUCATION NEEDS:   No education needs have been identified at this time  Skin:  Skin Assessment: Reviewed RN Assessment  Last BM:  12/22  Height:   Ht Readings from Last 1 Encounters:  10/25/19 5\' 6"  (1.676 m)    Weight:   Wt Readings from Last 1 Encounters:  10/31/19 59.2 kg    Ideal Body Weight:  64.5 kg  BMI:  Body mass index is 21.07 kg/m.  Estimated Nutritional Needs:   Kcal:  1560-1720 kcals  Protein:  78-86 g  Fluid:  >/= 1.5 L   Cate Bryer Cozzolino MS, RDN, LDN, CNSC [phone] Pager  [phone] Weekend/On-Call Pager

## 2019-11-01 LAB — BASIC METABOLIC PANEL
Anion gap: 13 (ref 5–15)
BUN: 21 mg/dL (ref 8–23)
CO2: 26 mmol/L (ref 22–32)
Calcium: 9.7 mg/dL (ref 8.9–10.3)
Chloride: 101 mmol/L (ref 98–111)
Creatinine, Ser: 0.9 mg/dL (ref 0.61–1.24)
GFR calc Af Amer: >60 mL/min (ref >60)
GFR calc non Af Amer: >60 mL/min (ref >60)
Glucose, Bld: 76 mg/dL (ref 70–99)
Potassium: 4.5 mmol/L (ref 3.5–5.1)
Sodium: 140 mmol/L (ref 135–145)

## 2019-11-01 LAB — GLUCOSE, CAPILLARY
Glucose-Capillary: 164 mg/dL — ABNORMAL HIGH (ref 70–99)
Glucose-Capillary: 49 mg/dL — ABNORMAL LOW (ref 70–99)
Glucose-Capillary: 54 mg/dL — ABNORMAL LOW (ref 70–99)
Glucose-Capillary: 61 mg/dL — ABNORMAL LOW (ref 70–99)
Glucose-Capillary: 65 mg/dL — ABNORMAL LOW (ref 70–99)
Glucose-Capillary: 69 mg/dL — ABNORMAL LOW (ref 70–99)

## 2019-11-01 MED ORDER — GLUCOSE 4 G PO CHEW
1.0000 | CHEWABLE_TABLET | Freq: Four times a day (QID) | ORAL | Status: DC
  Administered 2019-11-01 – 2019-11-05 (×15): 4 g via ORAL
  Filled 2019-11-01 (×9): qty 1

## 2019-11-01 MED ORDER — DEXTROSE 50 % IV SOLN
25.0000 g | INTRAVENOUS | Status: AC
  Administered 2019-11-01: 25 g via INTRAVENOUS
  Filled 2019-11-01: qty 50

## 2019-11-01 NOTE — Progress Notes (Signed)
Rechecked CBG post D50, CBG is 164.

## 2019-11-01 NOTE — Progress Notes (Signed)
Rechecked CBG, 15 mins post OJ CBG at 49. mD paged with result.

## 2019-11-01 NOTE — Progress Notes (Signed)
PROGRESS NOTE    Randy Chandler  [id number] DOB: 1930/01/19 DOA: 10/25/2019 PCP: Terressa Koyanagi, MD    Brief Narrative:  85 year old gentleman with history of non-Hodgkin's B-cell lymphoma, dementia, hypertension and hyperlipidemia who presented to the hospital with altered mental status and dehydration.  He was recently tested positive for COVID-19, found to have worsening confusion from assisted living facility. In the emergency room sodium was 160 with acute kidney injury. Chest x-ray showed bilateral infiltrate.  He was on room air since admission.   Assessment & Plan:   Active Problems:   Hypernatremia   Dehydration   Failure to thrive in adult   Generalized weakness   Acute metabolic encephalopathy   COVID-19 virus infection   AKI (acute kidney injury) ([place])  Acute metabolic encephalopathy in the setting of underlying advanced dementia due to hypernatremia and dehydration along with COVID-19 infection. Mental status remains depressed, however some clinical improvement than before.  He is able to open eyes and tries to keep communication today. Patient had inadequate oral intake, core track tube was placed and is started on tube feeding. Capillary blood sugars not corresponding with serum blood sugars.  We will continue on tube feeding, add dextrose tablets and monitor.  Head CT was normal.    Hypovolemic hypernatremia: Sodium levels improved on dextrose infusion and now on free water flush.  Continue monitoring.  COVID-19 viral infection: Inflammatory biomarkers are improving.  He is mostly on room air.  Since he had adequate improvement, he was given 2 days of IV steroids and then stopped.  Acute kidney injury: Improved with fluid.  Persistent hypoglycemia: Capillary blood sugars not corresponding with serum blood sugars.   Confirm hypeoglycemia with serum blood sugars.   Draw BMP, C-peptide, proinsulin when blood sugars less than 40.   Advanced dementia/debility:  Lives at assisted living.  With less interaction, will taper off Seroquel.  Use only at night.  Avoid all sedatives. Patient will need placement to a skilled nursing facility before going back to assisted living.  Continue to work with PT OT. Will need to establish oral intake before discharge.  DVT prophylaxis: Lovenox subcu Code Status: DNR Family Communication: Patient's daughter Dr. Mikeal Hawthorne. Disposition Plan: SNF after clinical improvement and establishment of oral intake.   Consultants:   None  Procedures:   None  Antimicrobials:  Anti-infectives (From admission, onward)   None         Subjective: Seen and examined.  Overnight events noted.  Blood sugars 37, no acute mental changes.  He was treated with 50% dextrose with improvement.  Unable to draw BMP at that time. Patient was more awake, he was able to open eyes and look at provider on attempted conversation. Appetite remains poor, however he is on 55 mL/h of tube feeding.  Objective: Vitals:   11/01/19 0400 11/01/19 0443 11/01/19 0744 11/01/19 1216  BP: 136/84  114/71 (!) 150/85  Pulse: 93  99 86  Resp: 18  17   Temp: 97.7 F (36.5 C)  98.5 F (36.9 C) 98.4 F (36.9 C)  TempSrc: Axillary  Axillary Axillary  SpO2: 100%  100% 100%  Weight:  59.5 kg    Height:        Intake/Output Summary (Last 24 hours) at 11/01/2019 1309 Last data filed at 11/01/2019 0500 Gross per 24 hour  Intake 1264 ml  Output 1000 ml  Net 264 ml   Filed Weights   10/27/19 0500 10/31/19 0500 11/01/19 0443  Weight: (S) 51.9 kg  59.2 kg 59.5 kg    Examination:  General exam: Appears calm and comfortable , on room air.  Not in any distress.   Respiratory system: Clear to auscultation. Respiratory effort normal.  No added sounds. Cardiovascular system: S1 & S2 heard, RRR. Gastrointestinal system: Abdomen is nondistended, soft and nontender. No organomegaly or masses felt. Normal bowel sounds heard. Central nervous system: Alert  and awake.  Not oriented.  Oriented to self. Extremities:  Moves all extremities.  Generalized weakness. Skin: No rashes, lesions or ulcers Psychiatry: Judgement and insight appear compromised.   Mood & affect flat.    Data Reviewed: I have personally reviewed following labs and imaging studies  CBC: Recent Labs  Lab 10/26/19 0443 10/26/19 2020 10/27/19 0606 10/28/19 0538 10/29/19 0554 10/30/19 0730  WBC 10.3 11.3* 11.2* 11.9* 7.6 8.7  NEUTROABS 9.4*  --  10.2* 10.1* 6.1 6.7  HGB 12.6* 12.7* 12.8* 13.4 13.1 12.8*  HCT 43.4 42.2 43.4 43.4 41.5 39.3  MCV 102.1* 99.5 99.5 97.1 93.9 92.7  PLT 255 244 241 202 285 [id number]   Basic Metabolic Panel: Recent Labs  Lab 10/26/19 0443 10/27/19 0606 10/28/19 0538 10/29/19 0554 10/30/19 0730 10/31/19 0555 10/31/19 1525  NA 162* 154* 146* 143 142 140  --   K 4.9 4.5 4.5 3.5 3.8 3.5  --   CL 125* 119* 113* 105 105 102  --   CO2 27 27 19* 26 27 30   --   GLUCOSE 162* 158* 133* 97 105* 96 96  BUN 48* 37* 27* 17 17 16   --   CREATININE 1.27* 1.15 1.04 0.91 0.93 0.87  --   CALCIUM 9.6 9.6 9.4 9.4 9.1 9.4  --   MG 2.8* 2.3 2.0 2.0 1.9  --   --   PHOS 3.2 3.3 2.9 3.4 3.2  --   --    GFR: Estimated Creatinine Clearance: 50.3 mL/min (by C-G formula based on SCr of 0.87 mg/dL). Liver Function Tests: Recent Labs  Lab 10/26/19 0443 10/27/19 0606 10/28/19 0538 10/29/19 0554 10/30/19 0730  AST 38 34 42* 42* 36  ALT 29 28 31  43 46*  ALKPHOS 55 57 70 59 62  BILITOT 0.4 0.7 0.8 1.0 0.4  PROT 8.5* 8.1 7.8 7.9 7.8  ALBUMIN 3.3* 3.2* 3.1* 3.3* 3.0*   No results for input(s): LIPASE, AMYLASE in the last 168 hours. No results for input(s): AMMONIA in the last 168 hours. Coagulation Profile: No results for input(s): INR, PROTIME in the last 168 hours. Cardiac Enzymes: No results for input(s): CKTOTAL, CKMB, CKMBINDEX, TROPONINI in the last 168 hours. BNP (last 3 results) No results for input(s): PROBNP in the last 8760 hours. HbA1C: No  results for input(s): HGBA1C in the last 72 hours. CBG: Recent Labs  Lab 10/31/19 2335 11/01/19 0007 11/01/19 0037 11/01/19 0950 11/01/19 1231  GLUCAP 37* 49* 164* 54* 61*   Lipid Profile: No results for input(s): CHOL, HDL, LDLCALC, TRIG, CHOLHDL, LDLDIRECT in the last 72 hours. Thyroid Function Tests: No results for input(s): TSH, T4TOTAL, FREET4, T3FREE, THYROIDAB in the last 72 hours. Anemia Panel: Recent Labs    10/30/19 0730  FERRITIN 665*   Sepsis Labs: Recent Labs  Lab 10/25/19 1658 10/25/19 1740 10/26/19 0443 10/27/19 0606  PROCALCITON 0.19  --  0.10 0.10  LATICACIDVEN  --  2.1*  --  2.4*    Recent Results (from the past 240 hour(s))  Urine culture     Status: None   Collection Time: 10/25/19  3:00 PM   Specimen: Urine, Random  Result Value Ref Range Status   Specimen Description   Final    URINE, RANDOM Performed at [facility], [place] [address] [place], [place] [address]    Special Requests   Final    NONE Performed at [facility], [place] [address], [place], [place] [address]    Culture   Final    NO GROWTH Performed at [facility], [place] [address] [place], [place] [address]    Report Status 10/26/2019 FINAL  Final  SARS CORONAVIRUS 2 (TAT 6-24 HRS) Nasopharyngeal Nasopharyngeal Swab     Status: Abnormal   Collection Time: 10/25/19  4:58 PM   Specimen: Nasopharyngeal Swab  Result Value Ref Range Status   SARS Coronavirus 2 POSITIVE (A) NEGATIVE Final    Comment: RESULT CALLED TO, READ BACK BY AND VERIFIED WITH: MBland Span 0110 10/26/2019 T. TYSOR (NOTE) SARS-CoV-2 target nucleic acids are DETECTED. The SARS-CoV-2 RNA is generally detectable in upper and lower respiratory specimens during the acute phase of infection. Positive results are indicative of the presence of SARS-CoV-2 RNA. Clinical correlation with patient history and other diagnostic information is  necessary to determine patient  infection status. Positive results do not rule out bacterial infection or co-infection with other viruses.  The expected result is Negative. Fact Sheet for Patients: SugarRoll.be Fact Sheet for Healthcare Providers: https://www.woods-mathews.com/ This test is not yet approved or cleared by the [place] FDA and  has been authorized for detection and/or diagnosis of SARS-CoV-2 by FDA under an Emergency Use Authorization (EUA). This EUA will remain  in effect (meaning this test can be used) for t he duration of the COVID-19 declaration under Section 564(b)(1) of the Act, 21 U.S.C. section 360bbb-3(b)(1), unless the authorization is terminated or revoked sooner. Performed at [facility], [place] [address] [place], [place] [address]   MRSA PCR Screening     Status: None   Collection Time: 10/25/19  7:08 PM   Specimen: Nasal Mucosa; Nasopharyngeal  Result Value Ref Range Status   MRSA by PCR NEGATIVE NEGATIVE Final    Comment:        The GeneXpert MRSA Assay (FDA approved for NASAL specimens only), is one component of a comprehensive MRSA colonization surveillance program. It is not intended to diagnose MRSA infection nor to guide or monitor treatment for MRSA infections. Performed at [facility], [place] [address], [place],  [address]          Radiology Studies: CT HEAD WO CONTRAST  Result Date: 10/31/2019 CLINICAL DATA:  Altered mental status. EXAM: CT HEAD WITHOUT CONTRAST TECHNIQUE: Contiguous axial images were obtained from the base of the skull through the vertex without intravenous contrast. COMPARISON:  None. FINDINGS: Brain: Mild motion artifact present. There is mild age related atrophy. Ventricles and cisterns are within normal. Minimal chronic ischemic microvascular disease is present. There is no mass, mass effect, shift of midline structures or acute hemorrhage. No evidence of acute  infarction. Vascular: No hyperdense vessel or unexpected calcification. Skull: Normal. Negative for fracture or focal lesion. Sinuses/Orbits: No acute finding. Other: Mild soft tissue swelling over the frontal scalp. IMPRESSION: 1. No acute brain injury. Mild soft tissue swelling over the frontal scalp. 2.  Minimal chronic ischemic microvascular disease. Electronically Signed   By: Marin Olp M.D.   On: 10/31/2019 13:44        Scheduled Meds: . Chlorhexidine Gluconate Cloth  6 each Topical Daily  . cholecalciferol  5,000 Units Oral Daily  . dorzolamide-timolol  1 drop Both Eyes BID  . feeding supplement (ENSURE ENLIVE)  237 mL Oral BID BM  . free water  200 mL Per Tube Q4H  . glucose  1 tablet Oral Q6H  . heparin  7,500 Units Subcutaneous Q8H  . latanoprost  1 drop Both Eyes QHS  . mouth rinse  15 mL Mouth Rinse BID  . QUEtiapine  12.5 mg Oral QHS  . tamsulosin  0.4 mg Oral QPC supper   Continuous Infusions: . feeding supplement (OSMOLITE 1.2 CAL) 1,000 mL (10/31/19 2200)     LOS: 7 days    Time spent: 30 minutes    Barb Merino, MD [facility] Pager [phone]

## 2019-11-02 LAB — GLUCOSE, CAPILLARY
Glucose-Capillary: 98 mg/dL (ref 70–99)
Glucose-Capillary: 75 mg/dL (ref 70–99)
Glucose-Capillary: 75 mg/dL (ref 70–99)
Glucose-Capillary: 92 mg/dL (ref 70–99)

## 2019-11-02 MED ORDER — ENOXAPARIN SODIUM 40 MG/0.4ML ~~LOC~~ SOLN
40.0000 mg | SUBCUTANEOUS | Status: DC
  Administered 2019-11-02 – 2019-11-04 (×3): 40 mg via SUBCUTANEOUS
  Filled 2019-11-02 (×3): qty 0.4

## 2019-11-02 NOTE — Progress Notes (Signed)
Physical Therapy Treatment Patient Details Name: Randy Chandler MRN: [id number] DOB: 1930/10/20 Today's Date: 11/02/2019    History of Present Illness Pt is an 85 yo male, recently tested positive for COVID-19, who presents with AMS and dehydration. CXR on admission without active disease. PMH: non-Hodgkin's B-cell lymphoma, dementia, HTN, HLD     PT Comments    Pt was found in bed and initially agreeable to tx, therapist explained what was to happen and he seemed ok with this. Upon attempt to move pt in bed pt started guarding against any movement, therapist again all that would happen was to sit edge of bed and pt verbalizes agreement but when attempting to move again becomes agitated grabbing at sheets and bed rails. With time he seemed to slightly participate more was minimally able to pull himself to semi sitting position edge of bed but still heavily leaning back and resisting any attempts to assist with mobility. Upon return to supine had same incidents pt holding on to bed rail and not allowing any movement. Therapist will attempt to see pt once more if pt is unable to participate in meaningful skilled PT tx then will have to sign off on case.     Follow Up Recommendations  SNF     Equipment Recommendations  None recommended by PT    Recommendations for Other Services       Precautions / Restrictions Precautions Precautions: Fall Precaution Comments: cognition, B hands in mitts Restrictions Weight Bearing Restrictions: No    Mobility  Bed Mobility Overal bed mobility: Needs Assistance Bed Mobility: Supine to Sit;Sit to Supine;Rolling Rolling: Total assist   Supine to sit: Total assist Sit to supine: Total assist   General bed mobility comments: pt guarding against and fighting all movements, clings on to bed rail and will not let go  Transfers Overall transfer level: Needs assistance Equipment used: 2 person hand held assist Transfers: Sit to/from Stand Sit to  Stand: Total assist;+2 physical assistance;+2 safety/equipment         General transfer comment: did not attempt this today for safety concern as pt is already guarding and fighting against any moevement even just in bed  Ambulation/Gait             General Gait Details: unable to stand without total a    Stairs             Wheelchair Mobility    Modified Rankin (Stroke Patients Only)       Balance Overall balance assessment: Needs assistance Sitting-balance support: Feet unsupported;Bilateral upper extremity supported Sitting balance-Leahy Scale: Zero       Standing balance-Leahy Scale: Zero                              Cognition Arousal/Alertness: Lethargic Behavior During Therapy: Agitated;Anxious;Restless Overall Cognitive Status: Impaired/Different from baseline Area of Impairment: Orientation;Attention;Memory;Following commands;Safety/judgement;Awareness;Problem solving                 Orientation Level: Disoriented to;Place;Time;Situation Current Attention Level: Alternating Memory: Decreased recall of precautions;Decreased short-term memory Following Commands: (follows no commands) Safety/Judgement: Decreased awareness of safety;Decreased awareness of deficits   Problem Solving: Slow processing;Decreased initiation;Difficulty sequencing;Requires verbal cues;Requires tactile cues General Comments: pt is highly agitated and anxious fighting against any motions in bed or attempt to sit EOB      Exercises      General Comments        Pertinent Vitals/Pain  Pain Assessment: Faces Faces Pain Scale: Hurts whole lot Pain Location: general with any mobility Pain Descriptors / Indicators: Discomfort;Grimacing;Guarding;Moaning Pain Intervention(s): Limited activity within patient's tolerance;Monitored during session    Home Living                      Prior Function            PT Goals (current goals can now be  found in the care plan section) Acute Rehab PT Goals Patient Stated Goal: pt did not state goals PT Goal Formulation: Patient unable to participate in goal setting Time For Goal Achievement: 11/11/19 Potential to Achieve Goals: Poor Progress towards PT goals: Not progressing toward goals - comment    Frequency    Min 2X/week      PT Plan Current plan remains appropriate    Co-evaluation              AM-PAC PT "6 Clicks" Mobility   Outcome Measure  Help needed turning from your back to your side while in a flat bed without using bedrails?: Total Help needed moving from lying on your back to sitting on the side of a flat bed without using bedrails?: Total Help needed moving to and from a bed to a chair (including a wheelchair)?: Total Help needed standing up from a chair using your arms (e.g., wheelchair or bedside chair)?: Total Help needed to walk in hospital room?: Total Help needed climbing 3-5 steps with a railing? : Total 6 Click Score: 6    End of Session   Activity Tolerance: Patient limited by fatigue;Patient limited by lethargy;Patient limited by pain;Treatment limited secondary to agitation;Treatment limited secondary to medical complications (Comment) Patient left: in bed;with call bell/phone within reach Nurse Communication: Mobility status PT Visit Diagnosis: Unsteadiness on feet (R26.81);Other abnormalities of gait and mobility (R26.89)     Time: [id number] PT Time Calculation (min) (ACUTE ONLY): 23 min  Charges:  $Therapeutic Activity: 23-37 mins                     Horald Chestnut, PT    Delford Field 11/02/2019, 4:26 PM

## 2019-11-02 NOTE — Progress Notes (Signed)
V.O received to stop tube feeds at this time to see how much pt will consume PO. Pt tolerated lunch and dinner well w/no issues. Aspiration and safety precautions in place. Nothing further to note.

## 2019-11-02 NOTE — Progress Notes (Signed)
PROGRESS NOTE    Randy Chandler  [id number] DOB: [birth year] DOA: 10/25/2019 PCP: Terressa Koyanagi, MD    Brief Narrative:  85 year old gentleman with history of non-Hodgkin's B-cell lymphoma, dementia, hypertension and hyperlipidemia who presented to the hospital with altered mental status and dehydration.  He was recently tested positive for COVID-19, found to have worsening confusion from assisted living facility. In the emergency room sodium was 160 with acute kidney injury. Chest x-ray showed bilateral infiltrate.  He was on room air since admission.  Assessment & Plan:   Active Problems:   Hypernatremia   Dehydration   Failure to thrive in adult   Generalized weakness   Acute metabolic encephalopathy   COVID-19 virus infection   AKI (acute kidney injury) ([place])  Acute metabolic encephalopathy in the setting of underlying advanced dementia due to hypernatremia and dehydration along with COVID-19 infection. Mental status with some improvement.   Patient had inadequate oral intake, core track tube was placed and he started on tube feeding. Patient able to eat with help.  We will challenge him today with his stopping tube feedings.  Core track will stay. Blood sugars better now with scheduled sugar tablet and tube feeding.  Continue close monitoring. Head CT normal.  Hypovolemic hypernatremia: Sodium levels improved on dextrose infusion and now on free water flush.  Continue monitoring.  COVID-19 viral infection: Normalized.  He is mostly on room air.  Since he had adequate improvement, he was given 2 days of IV steroids and then stopped.  Acute kidney injury: Improved with fluid.  Persistent hypoglycemia: Capillary blood sugars not corresponding with serum blood sugars.   Confirm hypeoglycemia with serum blood sugars.   Draw BMP, C-peptide, proinsulin when blood sugars less than 40.   Advanced dementia/debility: Lives at assisted living.  With less interaction, will taper  off Seroquel.  Stop today. Avoid all sedatives. Patient will need placement to a skilled nursing facility before going back to assisted living.  Continue to work with PT OT. Will need to establish oral intake before discharge.  DVT prophylaxis: Lovenox subcu Code Status: DNR Family Communication: Patient's daughter  Disposition Plan: SNF after clinical improvement and establishment of oral intake. Can move to MedSurg bed.   Consultants:   None  Procedures:   None  Antimicrobials:  Anti-infectives (From admission, onward)   None         Subjective: Seen and examined.  No overnight events.  He will respond, however not very interactive. Nursing reported that he ate half of his meal last night.  Usually in the morning, he remained sleepy and does not eat. Objective: Vitals:   11/02/19 0400 11/02/19 0446 11/02/19 0500 11/02/19 1020  BP: (!) 148/78   (!) 151/91  Pulse: 100   92  Resp: 20  17 20   Temp: 98.4 F (36.9 C)   98.2 F (36.8 C)  TempSrc: Axillary   Axillary  SpO2: 100%   100%  Weight:  60.4 kg    Height:        Intake/Output Summary (Last 24 hours) at 11/02/2019 1336 Last data filed at 11/02/2019 1000 Gross per 24 hour  Intake 2555 ml  Output 1750 ml  Net 805 ml   Filed Weights   10/31/19 0500 11/01/19 0443 11/02/19 0446  Weight: 59.2 kg 59.5 kg 60.4 kg    Examination:  General exam: Appears calm and comfortable , on room air.  Not in any distress.   Respiratory system: Clear to auscultation. Respiratory effort normal.  No added sounds. Cardiovascular system: S1 & S2 heard, RRR. Gastrointestinal system: Abdomen is nondistended, soft and nontender. No organomegaly or masses felt. Normal bowel sounds heard. Central nervous system: Alert and awake.  Not oriented.  Oriented to self. Extremities:  Moves all extremities.  Generalized weakness. Skin: No rashes, lesions or ulcers Psychiatry: Judgement and insight appear compromised.   Mood & affect  flat.    Data Reviewed: I have personally reviewed following labs and imaging studies  CBC: Recent Labs  Lab 10/26/19 2020 10/27/19 0606 10/28/19 0538 10/29/19 0554 10/30/19 0730  WBC 11.3* 11.2* 11.9* 7.6 8.7  NEUTROABS  --  10.2* 10.1* 6.1 6.7  HGB 12.7* 12.8* 13.4 13.1 12.8*  HCT 42.2 43.4 43.4 41.5 39.3  MCV 99.5 99.5 97.1 93.9 92.7  PLT 244 241 202 285 [id number]   Basic Metabolic Panel: Recent Labs  Lab 10/27/19 0606 10/28/19 0538 10/29/19 0554 10/30/19 0730 10/31/19 0555 10/31/19 1525 11/01/19 1256  NA 154* 146* 143 142 140  --  140  K 4.5 4.5 3.5 3.8 3.5  --  4.5  CL 119* 113* 105 105 102  --  101  CO2 27 19* 26 27 30   --  26  GLUCOSE 158* 133* 97 105* 96 96 76  BUN 37* 27* 17 17 16   --  21  CREATININE 1.15 1.04 0.91 0.93 0.87  --  0.90  CALCIUM 9.6 9.4 9.4 9.1 9.4  --  9.7  MG 2.3 2.0 2.0 1.9  --   --   --   PHOS 3.3 2.9 3.4 3.2  --   --   --    GFR: Estimated Creatinine Clearance: 49.4 mL/min (by C-G formula based on SCr of 0.9 mg/dL). Liver Function Tests: Recent Labs  Lab 10/27/19 0606 10/28/19 0538 10/29/19 0554 10/30/19 0730  AST 34 42* 42* 36  ALT 28 31 43 46*  ALKPHOS 57 70 59 62  BILITOT 0.7 0.8 1.0 0.4  PROT 8.1 7.8 7.9 7.8  ALBUMIN 3.2* 3.1* 3.3* 3.0*   No results for input(s): LIPASE, AMYLASE in the last 168 hours. No results for input(s): AMMONIA in the last 168 hours. Coagulation Profile: No results for input(s): INR, PROTIME in the last 168 hours. Cardiac Enzymes: No results for input(s): CKTOTAL, CKMB, CKMBINDEX, TROPONINI in the last 168 hours. BNP (last 3 results) No results for input(s): PROBNP in the last 8760 hours. HbA1C: No results for input(s): HGBA1C in the last 72 hours. CBG: Recent Labs  Lab 11/01/19 1637 11/01/19 2109 11/02/19 0439 11/02/19 0726 11/02/19 1151  GLUCAP 69* 65* 98 75 75   Lipid Profile: No results for input(s): CHOL, HDL, LDLCALC, TRIG, CHOLHDL, LDLDIRECT in the last 72 hours. Thyroid Function  Tests: No results for input(s): TSH, T4TOTAL, FREET4, T3FREE, THYROIDAB in the last 72 hours. Anemia Panel: No results for input(s): VITAMINB12, FOLATE, FERRITIN, TIBC, IRON, RETICCTPCT in the last 72 hours. Sepsis Labs: Recent Labs  Lab 10/27/19 0606  PROCALCITON 0.10  LATICACIDVEN 2.4*    Recent Results (from the past 240 hour(s))  Urine culture     Status: None   Collection Time: 10/25/19  3:00 PM   Specimen: Urine, Random  Result Value Ref Range Status   Specimen Description   Final    URINE, RANDOM Performed at [facility], [address] [place], [place] [address]    Special Requests   Final    NONE Performed at [facility], [address]  [place], [place] [address]    Culture   Final    NO GROWTH Performed at [facility], [place] [address], [place], [place] [address]    Report Status 10/26/2019 FINAL  Final  SARS CORONAVIRUS 2 (TAT 6-24 HRS) Nasopharyngeal Nasopharyngeal Swab     Status: Abnormal   Collection Time: 10/25/19  4:58 PM   Specimen: Nasopharyngeal Swab  Result Value Ref Range Status   SARS Coronavirus 2 POSITIVE (A) NEGATIVE Final    Comment: RESULT CALLED TO, READ BACK BY AND VERIFIED WITH: MBland Span 0110 10/26/2019 T. TYSOR (NOTE) SARS-CoV-2 target nucleic acids are DETECTED. The SARS-CoV-2 RNA is generally detectable in upper and lower respiratory specimens during the acute phase of infection. Positive results are indicative of the presence of SARS-CoV-2 RNA. Clinical correlation with patient history and other diagnostic information is  necessary to determine patient infection status. Positive results do not rule out bacterial infection or co-infection with other viruses.  The expected result is Negative. Fact Sheet for Patients: SugarRoll.be Fact Sheet for Healthcare Providers: https://www.woods-mathews.com/ This test is not yet approved or cleared by  the [place] FDA and  has been authorized for detection and/or diagnosis of SARS-CoV-2 by FDA under an Emergency Use Authorization (EUA). This EUA will remain  in effect (meaning this test can be used) for t he duration of the COVID-19 declaration under Section 564(b)(1) of the Act, 21 U.S.C. section 360bbb-3(b)(1), unless the authorization is terminated or revoked sooner. Performed at [facility], [place] [address], [place], [place] [address]   MRSA PCR Screening     Status: None   Collection Time: 10/25/19  7:08 PM   Specimen: Nasal Mucosa; Nasopharyngeal  Result Value Ref Range Status   MRSA by PCR NEGATIVE NEGATIVE Final    Comment:        The GeneXpert MRSA Assay (FDA approved for NASAL specimens only), is one component of a comprehensive MRSA colonization surveillance program. It is not intended to diagnose MRSA infection nor to guide or monitor treatment for MRSA infections. Performed at [facility], [place] [address], [place], [place] [address]          Radiology Studies: CT HEAD WO CONTRAST  Result Date: 10/31/2019 CLINICAL DATA:  Altered mental status. EXAM: CT HEAD WITHOUT CONTRAST TECHNIQUE: Contiguous axial images were obtained from the base of the skull through the vertex without intravenous contrast. COMPARISON:  None. FINDINGS: Brain: Mild motion artifact present. There is mild age related atrophy. Ventricles and cisterns are within normal. Minimal chronic ischemic microvascular disease is present. There is no mass, mass effect, shift of midline structures or acute hemorrhage. No evidence of acute infarction. Vascular: No hyperdense vessel or unexpected calcification. Skull: Normal. Negative for fracture or focal lesion. Sinuses/Orbits: No acute finding. Other: Mild soft tissue swelling over the frontal scalp. IMPRESSION: 1. No acute brain injury. Mild soft tissue swelling over the frontal scalp. 2.  Minimal chronic ischemic microvascular  disease. Electronically Signed   By: Marin Olp M.D.   On: 10/31/2019 13:44        Scheduled Meds: . Chlorhexidine Gluconate Cloth  6 each Topical Daily  . cholecalciferol  5,000 Units Oral Daily  . dorzolamide-timolol  1 drop Both Eyes BID  . enoxaparin (LOVENOX) injection  40 mg Subcutaneous Q24H  . feeding supplement (ENSURE ENLIVE)  237 mL Oral BID BM  . free water  200 mL Per Tube Q4H  . glucose  1 tablet Oral Q6H  .  latanoprost  1 drop Both Eyes QHS  . mouth rinse  15 mL Mouth Rinse BID  . QUEtiapine  12.5 mg Oral QHS  . tamsulosin  0.4 mg Oral QPC supper   Continuous Infusions:    LOS: 8 days    Time spent: 30 minutes    Barb Merino, MD [facility] Pager [phone]

## 2019-11-03 LAB — BASIC METABOLIC PANEL
Anion gap: 9 (ref 5–15)
BUN: 24 mg/dL — ABNORMAL HIGH (ref 8–23)
CO2: 26 mmol/L (ref 22–32)
Calcium: 9.4 mg/dL (ref 8.9–10.3)
Chloride: 99 mmol/L (ref 98–111)
Creatinine, Ser: 0.89 mg/dL (ref 0.61–1.24)
GFR calc Af Amer: >60 mL/min (ref >60)
GFR calc non Af Amer: >60 mL/min (ref >60)
Glucose, Bld: 101 mg/dL — ABNORMAL HIGH (ref 70–99)
Potassium: 4 mmol/L (ref 3.5–5.1)
Sodium: 134 mmol/L — ABNORMAL LOW (ref 135–145)

## 2019-11-03 LAB — GLUCOSE, CAPILLARY
Glucose-Capillary: 104 mg/dL — ABNORMAL HIGH (ref 70–99)
Glucose-Capillary: 100 mg/dL — ABNORMAL HIGH (ref 70–99)

## 2019-11-03 NOTE — TOC Progression Note (Signed)
Transition of Care [facility]) - Progression Note    Patient Details  Name: Randy Chandler MRN: [id number] Date of Birth: [birth year]  [facility] CM/SW Contact  Adiyah Lame, Francetta Found, LCSW Phone Number: 11/03/2019, 1:15 PM  Clinical Narrative:     Clinical Social Worker spoke with patient daughter, Adela Lank and provided available bed offers.  Patient daughter plans to communicate with patient's other daughter, Sharyn Lull and determine the order in which they are hopeful for bed availability once patient is medically ready.  Patient family aware that patient is getting closer to discharge and will have a plan by the morning.  TOC staff to follow up with family and connect with facilities of choice in hopes that a bed will be available on day of discharge.  CSW remains available for support to patient and family and will facilitate patient discharge needs once medically stable.  Expected Discharge Plan: [place] Barriers to Discharge: Continued Medical Work up  Expected Discharge Plan and Services Expected Discharge Plan: [place]   Discharge Planning Services: CM Consult Post Acute Care Choice: [facility] Living arrangements for the past 2 months: Assisted Living Facility([facility])                 DME Arranged: N/A DME Agency: NA       HH Arranged: NA HH Agency: NA         Social Determinants of Health (SDOH) Interventions    Readmission Risk Interventions No flowsheet data found.

## 2019-11-03 NOTE — Plan of Care (Signed)

## 2019-11-03 NOTE — Progress Notes (Signed)
PROGRESS NOTE    Randy Chandler  [id number] DOB: [birth year] DOA: 10/25/2019 PCP: Terressa Koyanagi, MD    Brief Narrative:  85 year old gentleman with history of non-Hodgkin's B-cell lymphoma, dementia, hypertension and hyperlipidemia who presented to the hospital with altered mental status and dehydration.  He was recently tested positive for COVID-19, found to have worsening confusion from assisted living facility. In the emergency room sodium was 160 with acute kidney injury. Chest x-ray showed bilateral infiltrate.  He was on room air since admission.  Assessment & Plan:   Active Problems:   Hypernatremia   Dehydration   Failure to thrive in adult   Generalized weakness   Acute metabolic encephalopathy   COVID-19 virus infection   AKI (acute kidney injury) ([place])  Acute metabolic encephalopathy in the setting of underlying advanced dementia due to hypernatremia and dehydration along with COVID-19 infection. Mental status with some improvement but remains persistently weak and deconditioned since admission. Patient had inadequate oral intake, core track tube was placed and he started on tube feeding. Patient able to eat 25 to 50% of meals.  Continue holding tube feedings and encourage assisted feeding.   Blood sugars better now with scheduled sugar tablet and tube feeding.  Continue close monitoring. Head CT normal.  Hypovolemic hypernatremia: Sodium levels improved on dextrose infusion and now on free water flush.  Continue monitoring as now he is on regular diet.  COVID-19 viral infection: Normalized.  He is mostly on room air.  Since he had adequate improvement, he was given 2 days of IV steroids and then stopped.  Acute kidney injury: Improved with fluid.  Persistent hypoglycemia: Capillary blood sugars not corresponding with serum blood sugars.   Confirm hypeoglycemia with serum blood sugars.   Draw BMP, C-peptide, proinsulin when blood sugars less than 40.   Advanced  dementia/debility: Lives at assisted living.  With less interaction, will taper off Seroquel.  Stop today. Avoid all sedatives. Patient will need placement to a skilled nursing facility before going back to assisted living.  Continue to work with PT OT.   DVT prophylaxis: Lovenox subcu Code Status: DNR Family Communication: Patient's daughter  Disposition Plan: SNF after clinical improvement and establishment of oral intake.  Anticipate another 24 to 48 hours. Can move to MedSurg bed.   Consultants:   None  Procedures:   None  Antimicrobials:  Anti-infectives (From admission, onward)   None         Subjective: Patient seen and examined.  No change in his status.  Nursing reported patient ate about 25% of the lunch and breakfast, 50% of the dinner. Objective: Vitals:   11/03/19 0000 11/03/19 0536 11/03/19 0753 11/03/19 1130  BP: 92/70 (!) 127/91 (!) 132/96 114/84  Pulse: 98 99 (!) 102 80  Resp:  18 17 18   Temp: 99.4 F (37.4 C) 99 F (37.2 C) 97.9 F (36.6 C) 98.1 F (36.7 C)  TempSrc: Axillary Axillary Axillary Axillary  SpO2: 100% 100% 100% 100%  Weight:      Height:        Intake/Output Summary (Last 24 hours) at 11/03/2019 1312 Last data filed at 11/03/2019 0900 Gross per 24 hour  Intake 880.92 ml  Output 1175 ml  Net -294.08 ml   Filed Weights   10/31/19 0500 11/01/19 0443 11/02/19 0446  Weight: 59.2 kg 59.5 kg 60.4 kg    Examination:  General exam: Appears calm and comfortable , on room air.  Not in any distress.   Respiratory system: Clear  to auscultation. Respiratory effort normal.  No added sounds. Cardiovascular system: S1 & S2 heard, RRR. Gastrointestinal system: Abdomen is nondistended, soft and nontender. No organomegaly or masses felt. Normal bowel sounds heard. Central nervous system: Alert and awake.  He just sleepy.  Not oriented.  Oriented to self. Extremities:  Moves all extremities.  Generalized weakness. Skin: No rashes, lesions or  ulcers Psychiatry: Judgement and insight appear compromised.   Mood & affect flat.    Data Reviewed: I have personally reviewed following labs and imaging studies  CBC: Recent Labs  Lab 10/28/19 0538 10/29/19 0554 10/30/19 0730  WBC 11.9* 7.6 8.7  NEUTROABS 10.1* 6.1 6.7  HGB 13.4 13.1 12.8*  HCT 43.4 41.5 39.3  MCV 97.1 93.9 92.7  PLT 202 285 [id number]   Basic Metabolic Panel: Recent Labs  Lab 10/28/19 0538 10/29/19 0554 10/30/19 0730 10/31/19 0555 10/31/19 1525 11/01/19 1256 11/03/19 0412  NA 146* 143 142 140  --  140 134*  K 4.5 3.5 3.8 3.5  --  4.5 4.0  CL 113* 105 105 102  --  101 99  CO2 19* 26 27 30   --  26 26  GLUCOSE 133* 97 105* 96 96 76 101*  BUN 27* 17 17 16   --  21 24*  CREATININE 1.04 0.91 0.93 0.87  --  0.90 0.89  CALCIUM 9.4 9.4 9.1 9.4  --  9.7 9.4  MG 2.0 2.0 1.9  --   --   --   --   PHOS 2.9 3.4 3.2  --   --   --   --    GFR: Estimated Creatinine Clearance: 50 mL/min (by C-G formula based on SCr of 0.89 mg/dL). Liver Function Tests: Recent Labs  Lab 10/28/19 0538 10/29/19 0554 10/30/19 0730  AST 42* 42* 36  ALT 31 43 46*  ALKPHOS 70 59 62  BILITOT 0.8 1.0 0.4  PROT 7.8 7.9 7.8  ALBUMIN 3.1* 3.3* 3.0*   No results for input(s): LIPASE, AMYLASE in the last 168 hours. No results for input(s): AMMONIA in the last 168 hours. Coagulation Profile: No results for input(s): INR, PROTIME in the last 168 hours. Cardiac Enzymes: No results for input(s): CKTOTAL, CKMB, CKMBINDEX, TROPONINI in the last 168 hours. BNP (last 3 results) No results for input(s): PROBNP in the last 8760 hours. HbA1C: No results for input(s): HGBA1C in the last 72 hours. CBG: Recent Labs  Lab 11/02/19 0439 11/02/19 0726 11/02/19 1151 11/02/19 1541 11/03/19 0759  GLUCAP 98 75 75 92 104*   Lipid Profile: No results for input(s): CHOL, HDL, LDLCALC, TRIG, CHOLHDL, LDLDIRECT in the last 72 hours. Thyroid Function Tests: No results for input(s): TSH, T4TOTAL, FREET4,  T3FREE, THYROIDAB in the last 72 hours. Anemia Panel: No results for input(s): VITAMINB12, FOLATE, FERRITIN, TIBC, IRON, RETICCTPCT in the last 72 hours. Sepsis Labs: No results for input(s): PROCALCITON, LATICACIDVEN in the last 168 hours.  Recent Results (from the past 240 hour(s))  Urine culture     Status: None   Collection Time: 10/25/19  3:00 PM   Specimen: Urine, Random  Result Value Ref Range Status   Specimen Description   Final    URINE, RANDOM Performed at [facility], [address] [place], [place] [address]    Special Requests   Final    NONE Performed at [facility], [place] [address], [place], [place] [address]    Culture   Final    NO GROWTH  Performed at [facility], [place] [address], [place], [place] [address]    Report Status 10/26/2019 FINAL  Final  SARS CORONAVIRUS 2 (TAT 6-24 HRS) Nasopharyngeal Nasopharyngeal Swab     Status: Abnormal   Collection Time: 10/25/19  4:58 PM   Specimen: Nasopharyngeal Swab  Result Value Ref Range Status   SARS Coronavirus 2 POSITIVE (A) NEGATIVE Final    Comment: RESULT CALLED TO, READ BACK BY AND VERIFIED WITH: MBland Span 0110 10/26/2019 T. TYSOR (NOTE) SARS-CoV-2 target nucleic acids are DETECTED. The SARS-CoV-2 RNA is generally detectable in upper and lower respiratory specimens during the acute phase of infection. Positive results are indicative of the presence of SARS-CoV-2 RNA. Clinical correlation with patient history and other diagnostic information is  necessary to determine patient infection status. Positive results do not rule out bacterial infection or co-infection with other viruses.  The expected result is Negative. Fact Sheet for Patients: SugarRoll.be Fact Sheet for Healthcare Providers: https://www.woods-mathews.com/ This test is not yet approved or cleared by the [place] FDA and  has been authorized for  detection and/or diagnosis of SARS-CoV-2 by FDA under an Emergency Use Authorization (EUA). This EUA will remain  in effect (meaning this test can be used) for t he duration of the COVID-19 declaration under Section 564(b)(1) of the Act, 21 U.S.C. section 360bbb-3(b)(1), unless the authorization is terminated or revoked sooner. Performed at [facility], [place] [address], [place], [place] [address]   MRSA PCR Screening     Status: None   Collection Time: 10/25/19  7:08 PM   Specimen: Nasal Mucosa; Nasopharyngeal  Result Value Ref Range Status   MRSA by PCR NEGATIVE NEGATIVE Final    Comment:        The GeneXpert MRSA Assay (FDA approved for NASAL specimens only), is one component of a comprehensive MRSA colonization surveillance program. It is not intended to diagnose MRSA infection nor to guide or monitor treatment for MRSA infections. Performed at [facility], [place] [address] [place], [place] [address]          Radiology Studies: No results found.      Scheduled Meds: . Chlorhexidine Gluconate Cloth  6 each Topical Daily  . cholecalciferol  5,000 Units Oral Daily  . dorzolamide-timolol  1 drop Both Eyes BID  . enoxaparin (LOVENOX) injection  40 mg Subcutaneous Q24H  . feeding supplement (ENSURE ENLIVE)  237 mL Oral BID BM  . glucose  1 tablet Oral Q6H  . latanoprost  1 drop Both Eyes QHS  . mouth rinse  15 mL Mouth Rinse BID  . tamsulosin  0.4 mg Oral QPC supper   Continuous Infusions:    LOS: 9 days    Time spent: 30 minutes    Barb Merino, MD [facility] Pager [phone]

## 2019-11-04 LAB — BASIC METABOLIC PANEL
Anion gap: 8 (ref 5–15)
BUN: 31 mg/dL — ABNORMAL HIGH (ref 8–23)
CO2: 28 mmol/L (ref 22–32)
Calcium: 9.6 mg/dL (ref 8.9–10.3)
Chloride: 102 mmol/L (ref 98–111)
Creatinine, Ser: 0.92 mg/dL (ref 0.61–1.24)
GFR calc Af Amer: >60 mL/min (ref >60)
GFR calc non Af Amer: >60 mL/min (ref >60)
Glucose, Bld: 102 mg/dL — ABNORMAL HIGH (ref 70–99)
Potassium: 4 mmol/L (ref 3.5–5.1)
Sodium: 138 mmol/L (ref 135–145)

## 2019-11-04 LAB — GLUCOSE, CAPILLARY
Glucose-Capillary: 100 mg/dL — ABNORMAL HIGH (ref 70–99)
Glucose-Capillary: 98 mg/dL (ref 70–99)
Glucose-Capillary: 86 mg/dL (ref 70–99)
Glucose-Capillary: 127 mg/dL — ABNORMAL HIGH (ref 70–99)
Glucose-Capillary: 71 mg/dL (ref 70–99)
Glucose-Capillary: 95 mg/dL (ref 70–99)
Glucose-Capillary: 142 mg/dL — ABNORMAL HIGH (ref 70–99)
Glucose-Capillary: 80 mg/dL (ref 70–99)
Glucose-Capillary: 127 mg/dL — ABNORMAL HIGH (ref 70–99)
Glucose-Capillary: 82 mg/dL (ref 70–99)
Glucose-Capillary: 91 mg/dL (ref 70–99)
Glucose-Capillary: 75 mg/dL (ref 70–99)

## 2019-11-04 NOTE — TOC Progression Note (Signed)
Transition of Care [facility]) - Progression Note    Patient Details  Name: Randy Chandler MRN: [id number] Date of Birth: 12/21/1929  [facility] of [facility]) CM/SW Contact  Joaquin Courts, RN Phone Number: 11/04/2019, 4:21 PM  Clinical Narrative:    CM spoke with patient's daughter Randy Chandler and provided bed offers. Daughter selects [facility] notified of selection.  Daughter did ask if they could hire a private duty caregiver to come into the facility, however doug states that he believes at this time that is not possible due to Covid restrictions.  This was communicated to the daughter who states she understands and is ok with this. CM will continue to follow and coordinate dc once patient is medically ready.    Expected Discharge Plan: [place] Barriers to Discharge: Continued Medical Work up  Expected Discharge Plan and Services Expected Discharge Plan: [place]   Discharge Planning Services: CM Consult Post Acute Care Choice: [place] Living arrangements for the past 2 months: Assisted Living Facility([facility])                 DME Arranged: N/A DME Agency: NA       HH Arranged: NA HH Agency: NA         Social Determinants of Health (SDOH) Interventions    Readmission Risk Interventions No flowsheet data found.

## 2019-11-04 NOTE — Progress Notes (Signed)
Report called to [facility], transported pt to rm 317 where RN assumed care.

## 2019-11-04 NOTE — Progress Notes (Signed)
Spoke with Dr. Vanita Ingles at front desk about discontinuing patients cardiac telemetry order, in order for patient to be on medsurg unit. Dr. Vanita Ingles stated that it's okay to discontinue patients cardiac telemetry order.

## 2019-11-04 NOTE — Plan of Care (Signed)

## 2019-11-04 NOTE — TOC Progression Note (Signed)
Transition of Care [facility]) - Progression Note    Patient Details  Name: Randy Chandler MRN: [id number] Date of Birth: 12/29/1929  Transition of Care [facility]) CM/SW Contact  Joaquin Courts, RN Phone Number: 11/04/2019, 1:50 PM  Clinical Narrative: CM called both daughters to follow-up on bed offers. Voicemail left, will await call back.       Expected Discharge Plan: [place] Barriers to Discharge: Continued Medical Work up  Expected Discharge Plan and Services Expected Discharge Plan: [place]   Discharge Planning Services: CM Consult Post Acute Care Choice: [place] Living arrangements for the past 2 months: Assisted Living Facility([facility])                 DME Arranged: N/A DME Agency: NA       HH Arranged: NA HH Agency: NA         Social Determinants of Health (SDOH) Interventions    Readmission Risk Interventions No flowsheet data found.

## 2019-11-04 NOTE — Progress Notes (Signed)
PROGRESS NOTE    Randy Chandler  [id number] DOB: [birth year] DOA: 10/25/2019 PCP: Terressa Koyanagi, MD    Brief Narrative:  85 year old gentleman with history of non-Hodgkin's B-cell lymphoma, dementia, hypertension and hyperlipidemia who presented to the hospital with altered mental status and dehydration.  He was recently tested positive for COVID-19, found to have worsening confusion from assisted living facility. In the emergency room sodium was 160 with acute kidney injury. Chest x-ray showed bilateral infiltrate.  He was on room air since admission.  Assessment & Plan:   Active Problems:   Hypernatremia   Dehydration   Failure to thrive in adult   Generalized weakness   Acute metabolic encephalopathy   COVID-19 virus infection   AKI (acute kidney injury) ([place])  Acute metabolic encephalopathy in the setting of underlying advanced dementia due to hypernatremia and dehydration along with COVID-19 infection: Some improvement of mental status. remains persistently weak and deconditioned since admission. Patient had inadequate oral intake, core track tube was placed and he started on tube feeding. Patient able to eat 25 to 50% of meals.  Continue holding tube feedings and encourage assisted feeding.   Blood sugars better now with scheduled sugar tablet and tube feeding.  Continue close monitoring. Head CT normal. If keeps up with oral intake today, will dc core trak.  Hypovolemic hypernatremia: Sodium levels improved on dextrose infusion and now on free water flush.  Continue monitoring as now he is on regular diet.  COVID-19 viral infection: Normalized.  He is mostly on room air.  Since he had adequate improvement, he was given 2 days of IV steroids and then stopped.  Acute kidney injury: Improved with fluid.  Persistent hypoglycemia: Capillary blood sugars not corresponding with serum blood sugars.   Confirm hypeoglycemia with serum blood sugars.   Now normalized.   Advanced  dementia/debility: Lives at assisted living.  Tapered off Seroquel. avoid all sedatives. Patient will need placement to a skilled nursing facility before going back to assisted living.  Continue to work with PT OT.   DVT prophylaxis: Lovenox subcu Code Status: DNR Family Communication: Patient's daughter  Disposition Plan: SNF after clinical improvement and establishment of oral intake. SW to look for placement.    Consultants:   None  Procedures:   None  Antimicrobials:  Anti-infectives (From admission, onward)   None         Subjective: Patient seen and examined.  No change in his status. Says yes or no.   Objective: Vitals:   11/04/19 0000 11/04/19 0400 11/04/19 0443 11/04/19 0804  BP: (!) 142/94 119/79 110/70 101/80  Pulse: 98 (!) 102 96   Resp: (!) 24 (!) 25 15 17   Temp: 98.9 F (37.2 C) 98.9 F (37.2 C) 97.6 F (36.4 C) 98.7 F (37.1 C)  TempSrc: Axillary Axillary Oral Axillary  SpO2: 100% 100% 97% 98%  Weight:      Height:        Intake/Output Summary (Last 24 hours) at 11/04/2019 1207 Last data filed at 11/04/2019 0400 Gross per 24 hour  Intake 118 ml  Output 745 ml  Net -627 ml   Filed Weights   10/31/19 0500 11/01/19 0443 11/02/19 0446  Weight: 59.2 kg 59.5 kg 60.4 kg    Examination:  Physical Exam  Constitutional: No distress.  Calm , comfortable, sleeping most of the time.  HENT:  Head: Atraumatic.  Eyes: Pupils are equal, round, and reactive to light.  Cardiovascular: Normal rate and regular rhythm.  Pulmonary/Chest: Breath  sounds normal.  Musculoskeletal:     Cervical back: Neck supple.  Psychiatric:  Flat affect       Data Reviewed: I have personally reviewed following labs and imaging studies  CBC: Recent Labs  Lab 10/29/19 0554 10/30/19 0730  WBC 7.6 8.7  NEUTROABS 6.1 6.7  HGB 13.1 12.8*  HCT 41.5 39.3  MCV 93.9 92.7  PLT 285 [id number]   Basic Metabolic Panel: Recent Labs  Lab 10/29/19 0554 10/30/19 0730  10/31/19 0555 10/31/19 1525 11/01/19 1256 11/03/19 0412 11/04/19 0330  NA 143 142 140  --  140 134* 138  K 3.5 3.8 3.5  --  4.5 4.0 4.0  CL 105 105 102  --  101 99 102  CO2 26 27 30   --  26 26 28   GLUCOSE 97 105* 96 96 76 101* 102*  BUN 17 17 16   --  21 24* 31*  CREATININE 0.91 0.93 0.87  --  0.90 0.89 0.92  CALCIUM 9.4 9.1 9.4  --  9.7 9.4 9.6  MG 2.0 1.9  --   --   --   --   --   PHOS 3.4 3.2  --   --   --   --   --    GFR: Estimated Creatinine Clearance: 48.3 mL/min (by C-G formula based on SCr of 0.92 mg/dL). Liver Function Tests: Recent Labs  Lab 10/29/19 0554 10/30/19 0730  AST 42* 36  ALT 43 46*  ALKPHOS 59 62  BILITOT 1.0 0.4  PROT 7.9 7.8  ALBUMIN 3.3* 3.0*   No results for input(s): LIPASE, AMYLASE in the last 168 hours. No results for input(s): AMMONIA in the last 168 hours. Coagulation Profile: No results for input(s): INR, PROTIME in the last 168 hours. Cardiac Enzymes: No results for input(s): CKTOTAL, CKMB, CKMBINDEX, TROPONINI in the last 168 hours. BNP (last 3 results) No results for input(s): PROBNP in the last 8760 hours. HbA1C: No results for input(s): HGBA1C in the last 72 hours. CBG: Recent Labs  Lab 11/03/19 2023 11/04/19 0056 11/04/19 0410 11/04/19 0810 11/04/19 1133  GLUCAP 127* 100* 98 86 95   Lipid Profile: No results for input(s): CHOL, HDL, LDLCALC, TRIG, CHOLHDL, LDLDIRECT in the last 72 hours. Thyroid Function Tests: No results for input(s): TSH, T4TOTAL, FREET4, T3FREE, THYROIDAB in the last 72 hours. Anemia Panel: No results for input(s): VITAMINB12, FOLATE, FERRITIN, TIBC, IRON, RETICCTPCT in the last 72 hours. Sepsis Labs: No results for input(s): PROCALCITON, LATICACIDVEN in the last 168 hours.  Recent Results (from the past 240 hour(s))  Urine culture     Status: None   Collection Time: 10/25/19  3:00 PM   Specimen: Urine, Random  Result Value Ref Range Status   Specimen Description   Final    URINE,  RANDOM Performed at [facility], [address], [place], [place] [address]    Special Requests   Final    NONE Performed at [facility], [place] [address], [place], [place] [address]    Culture   Final    NO GROWTH Performed at [facility], [place] [address] [place], [place] [address]    Report Status 10/26/2019 FINAL  Final  SARS CORONAVIRUS 2 (TAT 6-24 HRS) Nasopharyngeal Nasopharyngeal Swab     Status: Abnormal   Collection Time: 10/25/19  4:58 PM   Specimen: Nasopharyngeal Swab  Result Value Ref Range Status   SARS Coronavirus 2 POSITIVE (A) NEGATIVE Final  Comment: RESULT CALLED TO, READ BACK BY AND VERIFIED WITH: MBland Span 0110 10/26/2019 T. TYSOR (NOTE) SARS-CoV-2 target nucleic acids are DETECTED. The SARS-CoV-2 RNA is generally detectable in upper and lower respiratory specimens during the acute phase of infection. Positive results are indicative of the presence of SARS-CoV-2 RNA. Clinical correlation with patient history and other diagnostic information is  necessary to determine patient infection status. Positive results do not rule out bacterial infection or co-infection with other viruses.  The expected result is Negative. Fact Sheet for Patients: SugarRoll.be Fact Sheet for Healthcare Providers: https://www.woods-mathews.com/ This test is not yet approved or cleared by the [place] FDA and  has been authorized for detection and/or diagnosis of SARS-CoV-2 by FDA under an Emergency Use Authorization (EUA). This EUA will remain  in effect (meaning this test can be used) for t he duration of the COVID-19 declaration under Section 564(b)(1) of the Act, 21 U.S.C. section 360bbb-3(b)(1), unless the authorization is terminated or revoked sooner. Performed at [facility], [place] [address] [place], [place] [address]   MRSA PCR Screening     Status: None   Collection  Time: 10/25/19  7:08 PM   Specimen: Nasal Mucosa; Nasopharyngeal  Result Value Ref Range Status   MRSA by PCR NEGATIVE NEGATIVE Final    Comment:        The GeneXpert MRSA Assay (FDA approved for NASAL specimens only), is one component of a comprehensive MRSA colonization surveillance program. It is not intended to diagnose MRSA infection nor to guide or monitor treatment for MRSA infections. Performed at [facility], [place] [address] [place], [place] [address]          Radiology Studies: No results found.      Scheduled Meds: . Chlorhexidine Gluconate Cloth  6 each Topical Daily  . cholecalciferol  5,000 Units Oral Daily  . dorzolamide-timolol  1 drop Both Eyes BID  . enoxaparin (LOVENOX) injection  40 mg Subcutaneous Q24H  . feeding supplement (ENSURE ENLIVE)  237 mL Oral BID BM  . glucose  1 tablet Oral Q6H  . latanoprost  1 drop Both Eyes QHS  . mouth rinse  15 mL Mouth Rinse BID  . tamsulosin  0.4 mg Oral QPC supper   Continuous Infusions:    LOS: 10 days    Time spent: 30 minutes    Barb Merino, MD [facility] Pager [phone]

## 2019-11-05 LAB — BASIC METABOLIC PANEL
Anion gap: 11 (ref 5–15)
BUN: 23 mg/dL (ref 8–23)
CO2: 26 mmol/L (ref 22–32)
Calcium: 9.5 mg/dL (ref 8.9–10.3)
Chloride: 101 mmol/L (ref 98–111)
Creatinine, Ser: 0.93 mg/dL (ref 0.61–1.24)
GFR calc Af Amer: >60 mL/min (ref >60)
GFR calc non Af Amer: >60 mL/min (ref >60)
Glucose, Bld: 102 mg/dL — ABNORMAL HIGH (ref 70–99)
Potassium: 4.1 mmol/L (ref 3.5–5.1)
Sodium: 138 mmol/L (ref 135–145)

## 2019-11-05 LAB — GLUCOSE, CAPILLARY
Glucose-Capillary: 105 mg/dL — ABNORMAL HIGH (ref 70–99)
Glucose-Capillary: 140 mg/dL — ABNORMAL HIGH (ref 70–99)
Glucose-Capillary: 95 mg/dL (ref 70–99)

## 2019-11-05 MED ORDER — GLUCOSE 4 G PO CHEW: 1.0000 | CHEWABLE_TABLET | Freq: Two times a day (BID) | ORAL | 12 refills | Status: AC

## 2019-11-05 NOTE — Plan of Care (Signed)
Call placed to [facility] 5 times to give report no one answered. Nurse from [facility] has my number will call me for report.

## 2019-11-05 NOTE — Discharge Summary (Signed)
Physician Discharge Summary  Randy Chandler [id number] DOB: May 15, 1930 DOA: 10/25/2019  PCP: Terressa Koyanagi, MD  Admit date: 10/25/2019 Discharge date: 11/05/2019  Admitted From: ALF Disposition:  SNF  Recommendations for Outpatient Follow-up:  1. Follow up with PCP in 1-2 weeks 2. Please obtain BMP/CBC in one week  Discharge Condition: stable  CODE STATUS:DNR Diet recommendation: regular diet, assisted feeding.  Continue multiple frequent feeding. Dysphagia 1 diet with thin liquids.  Full supervision.  Meds crushed in pure. Ensure supplement, he likes chocolate Ensure.  Discharge summary:  85 year old gentleman with history of non-Hodgkin's B-cell lymphoma, dementia, hypertension and hyperlipidemia who presented to the hospital with altered mental status and dehydration. He was recently tested positive for COVID-19, found to have worsening confusion from assisted living facility. In the emergency room sodium was 160 with acute kidney injury. Chest x-ray showed bilateral infiltrate.  He was on room air since admission.  Patient was admitted to the hospital and treated for following conditions:  Acute metabolic encephalopathy in the setting of underlying advanced dementia due to hypernatremia and dehydration along with COVID-19 infection. Mental status with some improvement but remains persistently weak and deconditioned since admission.  Initially patient had inadequate oral intake, core track tube was placed and he was  started on tube feeding. Last 48 hours, patient is able to eat 25 to 50% of his meals.  Keeping up with his sodium levels, BUN and creatinine levels.  Blood sugars better.  Feeding tube discontinued. Head CT normal.  Hypovolemic hypernatremia: Sodium levels improved initially on dextrose and free water.  Now on oral diet.    COVID-19 viral infection:  Improved.  He is mostly on room air.  Since he had adequate improvement, he was given 2 days of IV steroids and  then stopped.  Acute kidney injury: Improved with fluid.  Persistent hypoglycemia: Capillary blood sugars not corresponding with serum blood sugars. Blood sugars improved.  Kept on sugar replacement along with regular diet. Please continue sugar tablets 4 g twice a day until he has good improvement.  Advanced dementia/debility: He was living at at assisted living facility. Patient was using Seroquel before coming to the hospital, it was tapered off and he has not used any for last 72 hours.  Patient has dementia, he may get agitated, if that the case, he can go back on Seroquel.  Previously he was on Seroquel 12.5 mg in the morning, 50 mg at night. He has also not used tramadol for the last 10 days in the hospital.  If he starts having good ambulation, may need additional pain medications.  Now managed with Tylenol.  Patient remains debilitated.  He is fairly stable to transfer to skilled level of care to continue improve his nutrition and mobility with therapies.  Discharge Diagnoses:  Active Problems:   Hypernatremia   Dehydration   Failure to thrive in adult   Generalized weakness   Acute metabolic encephalopathy   COVID-19 virus infection   AKI (acute kidney injury) [facility])    Discharge Instructions  Discharge Instructions    Diet general   Complete by: As directed    Assist feeding will be needed   Increase activity slowly   Complete by: As directed      Allergies as of 11/05/2019      Reactions   Hytrin [terazosin]       Medication List    STOP taking these medications   QUEtiapine 25 MG tablet Commonly known as: SEROQUEL   QUEtiapine  50 MG tablet Commonly known as: SEROQUEL   traMADol 50 MG tablet Commonly known as: ULTRAM   traZODone 50 MG tablet Commonly known as: DESYREL     TAKE these medications   acetaminophen 500 MG tablet Commonly known as: TYLENOL Take 1,000 mg by mouth 2 (two) times daily.   amLODipine 2.5 MG tablet Commonly known as:  NORVASC Take 2.5 mg by mouth daily.   dorzolamide-timolol 22.3-6.8 MG/ML ophthalmic solution Commonly known as: COSOPT Place 1 drop into both eyes 2 (two) times daily.   glucose 4 GM chewable tablet Chew 1 tablet (4 g total) by mouth 2 (two) times daily.   latanoprost 0.005 % ophthalmic solution Commonly known as: XALATAN Place 1 drop into both eyes at bedtime.   lidocaine 5 % Commonly known as: Lidoderm Place 1 patch onto the skin daily. Remove & Discard patch within 12 hours or as directed by MD   meloxicam 7.5 MG tablet Commonly known as: MOBIC Take 7.5 mg by mouth daily.   tamsulosin 0.4 MG Caps capsule Commonly known as: FLOMAX Take 0.4 mg by mouth daily after supper.   Vitamin D3 125 MCG (5000 UT) Caps Take 1 capsule by mouth daily.      Contact information for after-discharge care    [facility] .   Service: Skilled Nursing Contact information: [address] [place] [address] [phone]             Allergies  Allergen Reactions  . Hytrin [Terazosin]     Consultations:  None   Procedures/Studies: CT HEAD WO CONTRAST  Result Date: 10/31/2019 CLINICAL DATA:  Altered mental status. EXAM: CT HEAD WITHOUT CONTRAST TECHNIQUE: Contiguous axial images were obtained from the base of the skull through the vertex without intravenous contrast. COMPARISON:  None. FINDINGS: Brain: Mild motion artifact present. There is mild age related atrophy. Ventricles and cisterns are within normal. Minimal chronic ischemic microvascular disease is present. There is no mass, mass effect, shift of midline structures or acute hemorrhage. No evidence of acute infarction. Vascular: No hyperdense vessel or unexpected calcification. Skull: Normal. Negative for fracture or focal lesion. Sinuses/Orbits: No acute finding. Other: Mild soft tissue swelling over the frontal scalp. IMPRESSION: 1. No acute brain injury. Mild soft  tissue swelling over the frontal scalp. 2.  Minimal chronic ischemic microvascular disease. Electronically Signed   By: Marin Olp M.D.   On: 10/31/2019 13:44   DG Chest Port 1 View  Result Date: 10/26/2019 CLINICAL DATA:  Shortness of breath EXAM: PORTABLE CHEST 1 VIEW COMPARISON:  10/25/2019 FINDINGS: The heart size and mediastinal contours are within normal limits. Calcific aortic knob. No focal airspace consolidation. No pleural effusion. There is a thin curvilinear line at the lateral aspect of the right hemithorax which may reflect a skin fold versus a small pneumothorax. This was not seen on the previous exam. The visualized skeletal structures are unremarkable. IMPRESSION: Possible small (<10%) right-sided pneumothorax. Repeat upright expiratory PA chest radiograph could be performed to confirm. These results will be called to the ordering clinician or representative by the Radiologist Assistant, and communication documented in the PACS or zVision Dashboard. Electronically Signed   By: Davina Poke M.D.   On: 10/26/2019 11:38   DG Chest Portable 1 View  Result Date: 10/25/2019 CLINICAL DATA:  COVID-19. EXAM: PORTABLE CHEST 1 VIEW COMPARISON:  None. FINDINGS: The heart size and mediastinal contours are within normal limits. Both lungs are clear.  The visualized skeletal structures are unremarkable. IMPRESSION: No active disease. Electronically Signed   By: Marijo Conception M.D.   On: 10/25/2019 14:01     Subjective: Patient seen and examined.  No overnight events.  Answers yes and no, eating almost 80- 100% of meals with assist. Early morning blood pressures reported low 85/67, however with no intervention blood pressures were 122/105 at the lunchtime.   Discharge Exam: Vitals:   11/05/19 0936 11/05/19 1157  BP: (!) 85/67 (!) 122/105  Pulse:    Resp:    Temp:    SpO2:     Vitals:   11/05/19 0431 11/05/19 0722 11/05/19 0936 11/05/19 1157  BP:  (!) 86/65 (!) 85/67 (!) 122/105   Pulse:  86    Resp:  18    Temp:  97.8 F (36.6 C)    TempSrc:  Axillary    SpO2:  100%    Weight: 59.2 kg     Height:        General: Pt is alert, awake, not in acute distress, on room air. Cardiovascular: RRR, S1/S2 +, no rubs, no gallops Respiratory: CTA bilaterally, no wheezing, no rhonchi Abdominal: Soft, NT, ND, bowel sounds + Extremities: no edema, no cyanosis Patient is alert and awake, he is not oriented.  He just oriented to self.  Answers very simple questions.  No focal deficits.    The results of significant diagnostics from this hospitalization (including imaging, microbiology, ancillary and laboratory) are listed below for reference.     Microbiology: No results found for this or any previous visit (from the past 240 hour(s)).   Labs: BNP (last 3 results) No results for input(s): BNP in the last 8760 hours. Basic Metabolic Panel: Recent Labs  Lab 10/30/19 0730 10/31/19 0555 10/31/19 1525 11/01/19 1256 11/03/19 0412 11/04/19 0330 11/05/19 0204  NA 142 140  --  140 134* 138 138  K 3.8 3.5  --  4.5 4.0 4.0 4.1  CL 105 102  --  101 99 102 101  CO2 27 30  --  26 26 28 26   GLUCOSE 105* 96 96 76 101* 102* 102*  BUN 17 16  --  21 24* 31* 23  CREATININE 0.93 0.87  --  0.90 0.89 0.92 0.93  CALCIUM 9.1 9.4  --  9.7 9.4 9.6 9.5  MG 1.9  --   --   --   --   --   --   PHOS 3.2  --   --   --   --   --   --    Liver Function Tests: Recent Labs  Lab 10/30/19 0730  AST 36  ALT 46*  ALKPHOS 62  BILITOT 0.4  PROT 7.8  ALBUMIN 3.0*   No results for input(s): LIPASE, AMYLASE in the last 168 hours. No results for input(s): AMMONIA in the last 168 hours. CBC: Recent Labs  Lab 10/30/19 0730  WBC 8.7  NEUTROABS 6.7  HGB 12.8*  HCT 39.3  MCV 92.7  PLT 279   Cardiac Enzymes: No results for input(s): CKTOTAL, CKMB, CKMBINDEX, TROPONINI in the last 168 hours. BNP: Invalid input(s): POCBNP CBG: Recent Labs  Lab 11/04/19 1637 11/04/19 2024  11/05/19 0414 11/05/19 0757 11/05/19 1134  GLUCAP 82 75 105* 140* 95   D-Dimer No results for input(s): DDIMER in the last 72 hours. Hgb A1c No results for input(s): HGBA1C in the last 72 hours. Lipid Profile No results for input(s): CHOL, HDL, LDLCALC, TRIG,  CHOLHDL, LDLDIRECT in the last 72 hours. Thyroid function studies No results for input(s): TSH, T4TOTAL, T3FREE, THYROIDAB in the last 72 hours.  Invalid input(s): FREET3 Anemia work up No results for input(s): VITAMINB12, FOLATE, FERRITIN, TIBC, IRON, RETICCTPCT in the last 72 hours. Urinalysis    Component Value Date/Time   COLORURINE YELLOW 10/25/2019 1500   APPEARANCEUR CLEAR 10/25/2019 1500   LABSPEC 1.025 10/25/2019 1500   PHURINE 5.0 10/25/2019 1500   GLUCOSEU NEGATIVE 10/25/2019 1500   HGBUR SMALL (A) 10/25/2019 1500   BILIRUBINUR NEGATIVE 10/25/2019 1500   KETONESUR NEGATIVE 10/25/2019 1500   PROTEINUR 100 (A) 10/25/2019 1500   NITRITE NEGATIVE 10/25/2019 1500   LEUKOCYTESUR NEGATIVE 10/25/2019 1500   Sepsis Labs Invalid input(s): PROCALCITONIN,  WBC,  LACTICIDVEN Microbiology No results found for this or any previous visit (from the past 240 hour(s)).   Time coordinating discharge:  35 minutes  SIGNED:   Barb Merino, MD  [facility] 11/05/2019, 12:37 PM

## 2019-11-05 NOTE — TOC Progression Note (Signed)
Transition of Care [facility]) - Progression Note    Patient Details  Name: Randy Chandler MRN: [id number] Date of Birth: 17-Dec-1929  Transition of Care [facility]) CM/SW Contact  Joaquin Courts, RN Phone Number: 11/05/2019, 1:49 PM  Clinical Narrative:    CM confirmed bed availability at [facility]. Patient will discharge to room 208. DC summary faxed to facility, patient will transport by [facility]. Patient's daughter michele made aware of dc plan.   Bedside RN please call report to [phone]. Please print and place facesheet and medical necessity form in DC envelope along with yellow DNR form and any scripts.    Expected Discharge Plan: [place] Barriers to Discharge: Continued Medical Work up  Expected Discharge Plan and Services Expected Discharge Plan: [place]   Discharge Planning Services: CM Consult Post Acute Care Choice: [place] Living arrangements for the past 2 months: Assisted Living Facility([facility]) Expected Discharge Date: 11/05/19               DME Arranged: N/A DME Agency: NA       HH Arranged: NA HH Agency: NA         Social Determinants of Health (SDOH) Interventions    Readmission Risk Interventions No flowsheet data found.

## 2020-11-23 IMAGING — CT CT HEAD W/O CM
1 of 3 series · 15 of 30 positions shown, 19 images · non-contrast
Comparison: None.

CLINICAL DATA: Altered mental status.

EXAM:
CT HEAD WITHOUT CONTRAST
TECHNIQUE: Contiguous axial images were obtained from the base of the skull
through the vertex without intravenous contrast.

[Series 3: head w/o st · axial · non-contrast · 0.43mm/px · z∈[-71,+73]mm · 15 of 109 slices shown, 19 images]
[im 7/109  brain]
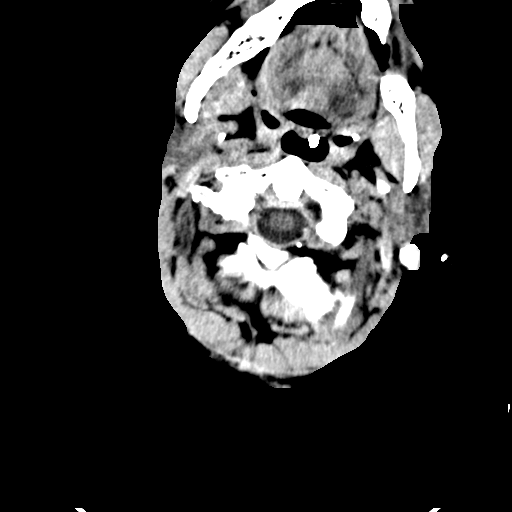
[im 7/109  bone]
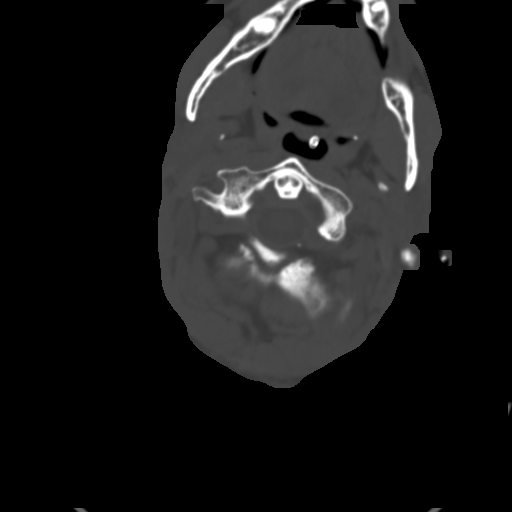
[im 13/109  brain]
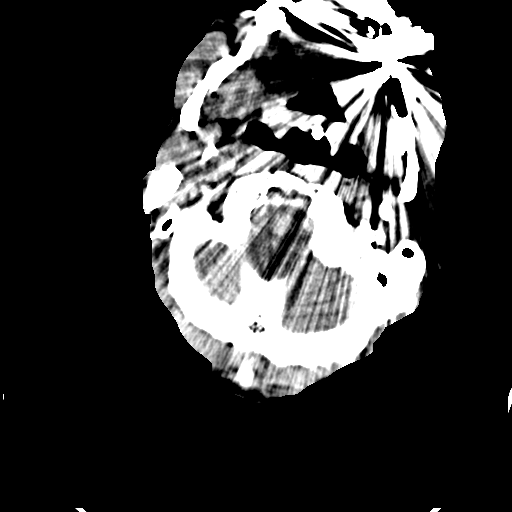
[im 19/109  brain]
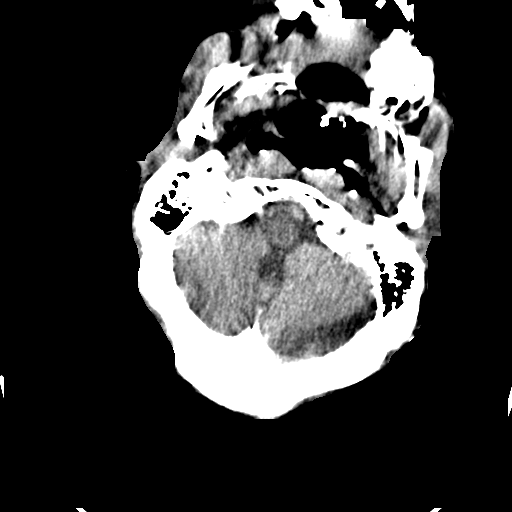
[im 25/109  brain]
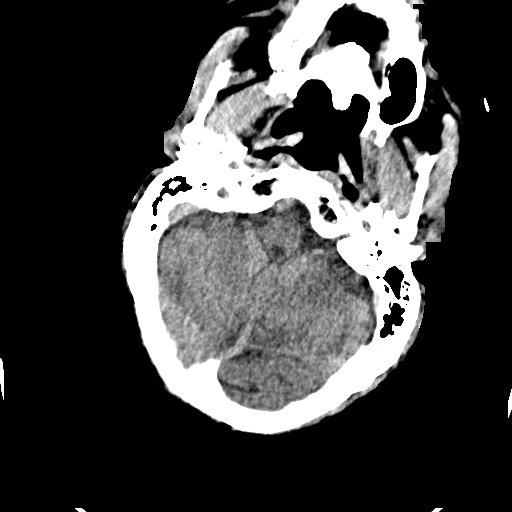
[im 37/109  brain]
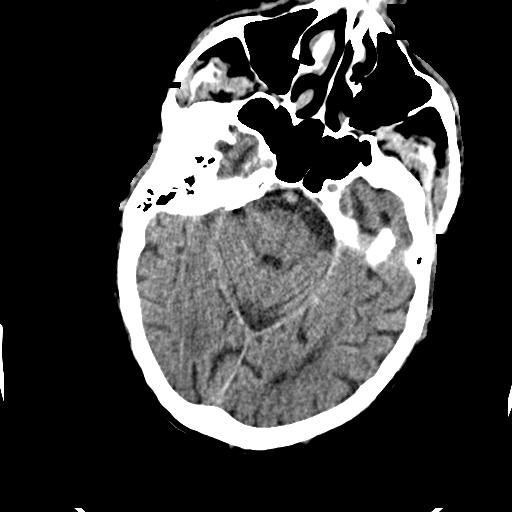
[im 37/109  bone]
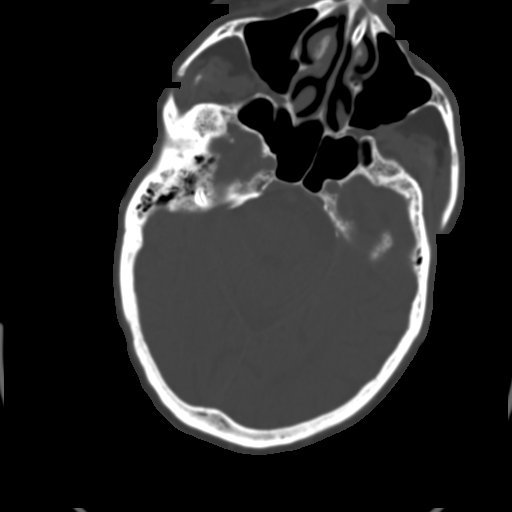
[im 43/109  brain]
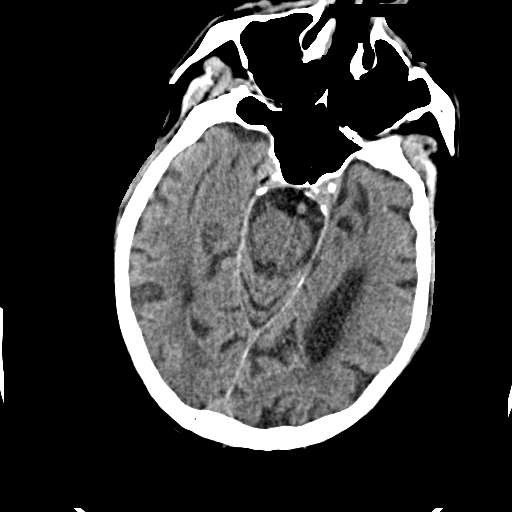
[im 49/109  brain]
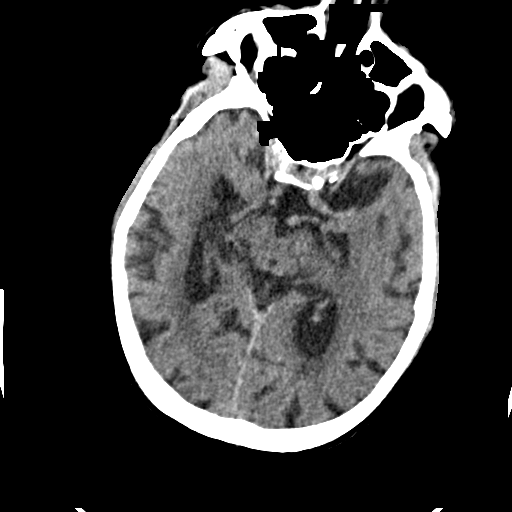
[im 55/109  brain]
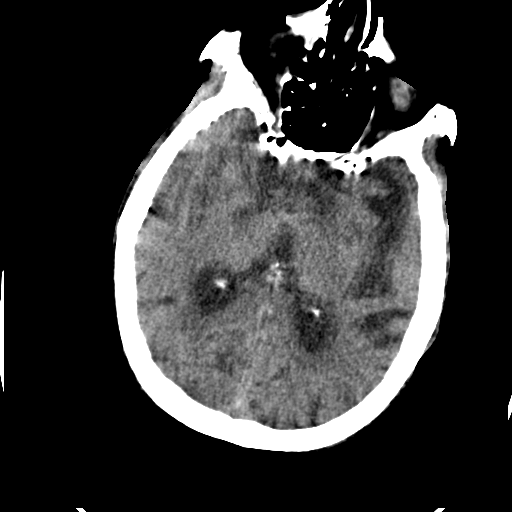
[im 61/109  brain]
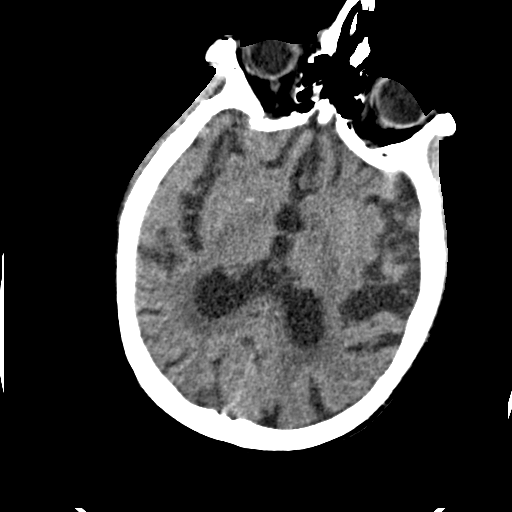
[im 61/109  bone]
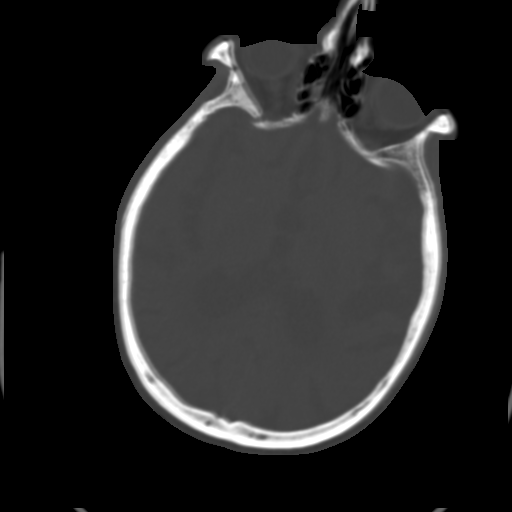
[im 67/109  brain]
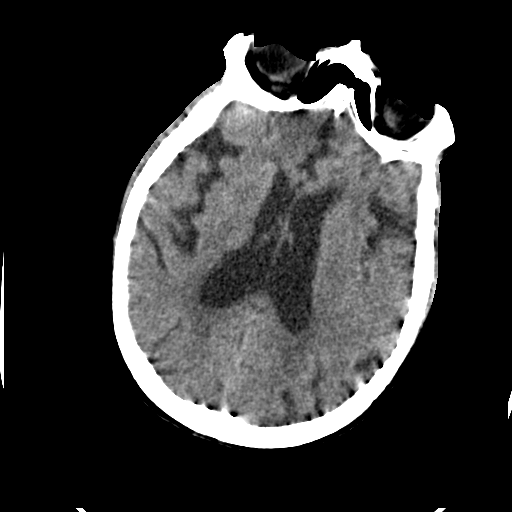
[im 73/109  brain]
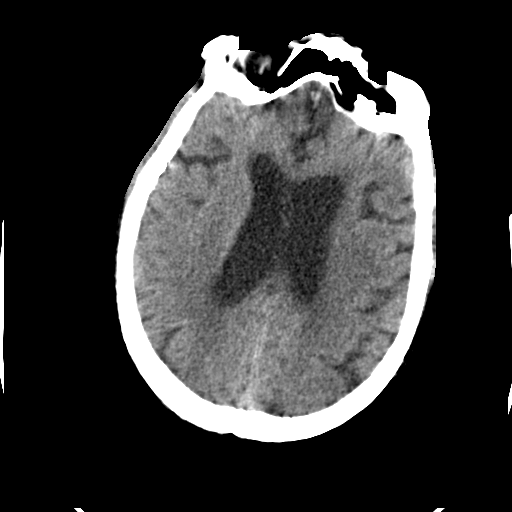
[im 85/109  brain]
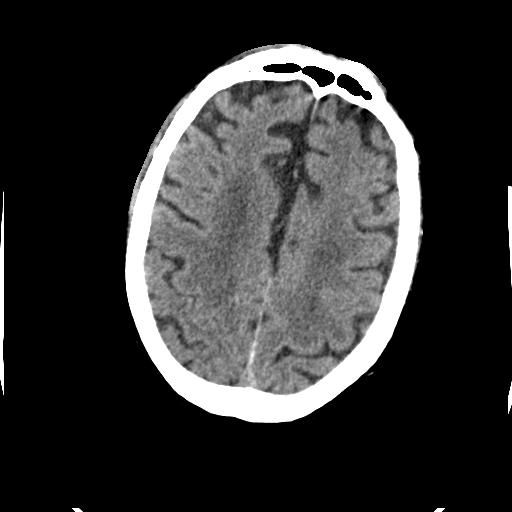
[im 91/109  brain]
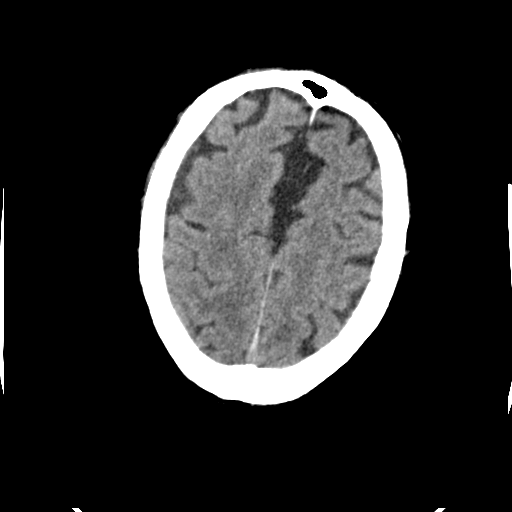
[im 91/109  bone]
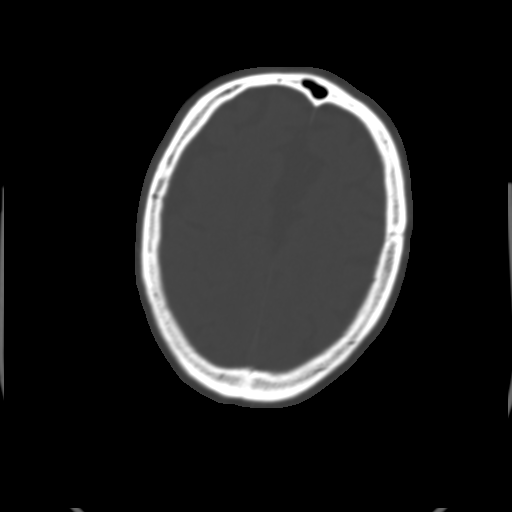
[im 97/109  brain]
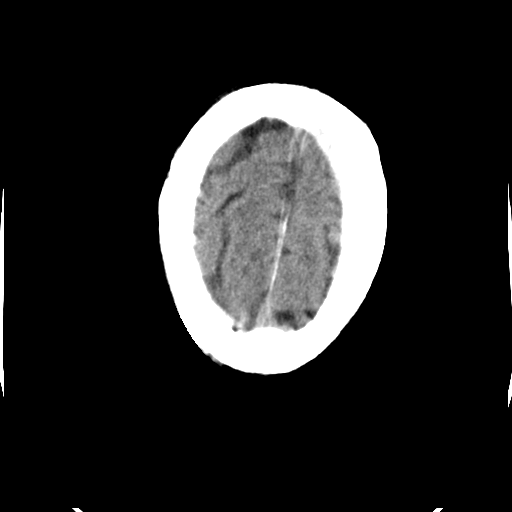
[im 103/109  brain]
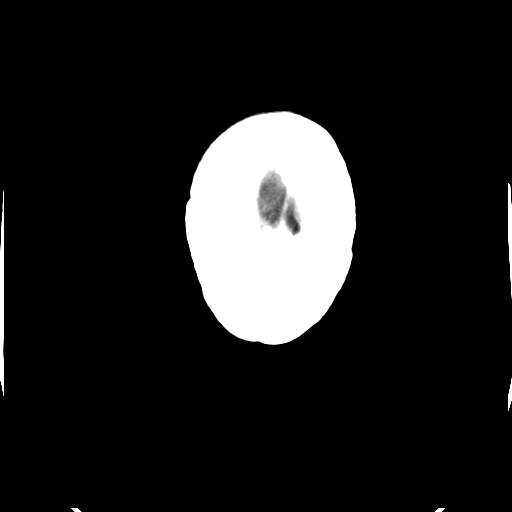

[15 of 30 positions shown; findings below may reference images not displayed]

FINDINGS: Brain: Mild motion artifact present. There is mild age related
atrophy. Ventricles and cisterns are within normal. Minimal chronic
ischemic microvascular disease is present. There is no mass, mass
effect, shift of midline structures or acute hemorrhage. No evidence
of acute infarction.

Vascular: No hyperdense vessel or unexpected calcification.

Skull: Normal. Negative for fracture or focal lesion.

Sinuses/Orbits: No acute finding.

Other: Mild soft tissue swelling over the frontal scalp.
IMPRESSION: 1. No acute brain injury. Mild soft tissue swelling over the frontal
scalp.

2.  Minimal chronic ischemic microvascular disease.

## 2023-03-11 ENCOUNTER — Other Ambulatory Visit: Payer: Self-pay

## 2023-03-11 ENCOUNTER — Encounter (HOSPITAL_COMMUNITY): Payer: Self-pay

## 2023-03-11 ENCOUNTER — Emergency Department (HOSPITAL_COMMUNITY)
Admission: EM | Admit: 2023-03-11 | Discharge: 2023-03-12 | Disposition: A | Discharge status: Home / Self Care | Payer: Medicare Other | Attending: Emergency Medicine | Admitting: Emergency Medicine

## 2023-03-11 DIAGNOSIS — E860 Dehydration: Secondary | ICD-10-CM

## 2023-03-11 DIAGNOSIS — F0390 Unspecified dementia without behavioral disturbance: Secondary | ICD-10-CM | POA: Insufficient documentation

## 2023-03-11 DIAGNOSIS — R4182 Altered mental status, unspecified: Secondary | ICD-10-CM | POA: Diagnosis present

## 2023-03-11 DIAGNOSIS — R531 Weakness: Secondary | ICD-10-CM

## 2023-03-11 LAB — CBC
HCT: 38.8 % — ABNORMAL LOW (ref 39.0–52.0)
Hemoglobin: 12.2 g/dL — ABNORMAL LOW (ref 13.0–17.0)
MCH: 29.5 pg (ref 26.0–34.0)
MCHC: 31.4 g/dL (ref 30.0–36.0)
MCV: 93.7 fL (ref 80.0–100.0)
Platelets: 317 10*3/uL (ref 150–400)
RBC: 4.14 MIL/uL — ABNORMAL LOW (ref 4.22–5.81)
RDW: 15.2 % (ref 11.5–15.5)
WBC: 6.5 10*3/uL (ref 4.0–10.5)
nRBC: 0 % (ref 0.0–0.2)

## 2023-03-11 LAB — BASIC METABOLIC PANEL
Anion gap: 9 (ref 5–15)
BUN: 35 mg/dL — ABNORMAL HIGH (ref 8–23)
CO2: 27 mmol/L (ref 22–32)
Calcium: 9.2 mg/dL (ref 8.9–10.3)
Chloride: 101 mmol/L (ref 98–111)
Creatinine, Ser: 1.07 mg/dL (ref 0.61–1.24)
GFR, Estimated: >60 mL/min (ref >60)
Glucose, Bld: 84 mg/dL (ref 70–99)
Potassium: 4.9 mmol/L (ref 3.5–5.1)
Sodium: 137 mmol/L (ref 135–145)

## 2023-03-11 LAB — URINALYSIS, ROUTINE W REFLEX MICROSCOPIC
Bacteria, UA: NONE SEEN
Bilirubin Urine: NEGATIVE
Glucose, UA: NEGATIVE mg/dL
Hgb urine dipstick: NEGATIVE
Ketones, ur: NEGATIVE mg/dL
Leukocytes,Ua: NEGATIVE
Nitrite: NEGATIVE
Protein, ur: NEGATIVE mg/dL
Specific Gravity, Urine: 1.01 (ref 1.005–1.030)
pH: 7 (ref 5.0–8.0)

## 2023-03-11 MED ORDER — ENSURE ENLIVE PO LIQD
237.0000 mL | Freq: Two times a day (BID) | ORAL | Status: DC
  Filled 2023-03-11 (×6): qty 237

## 2023-03-11 MED ORDER — SODIUM CHLORIDE 0.9 % IV BOLUS
1000.0000 mL | Freq: Once | INTRAVENOUS | Status: AC
  Administered 2023-03-11: 1000 mL via INTRAVENOUS

## 2023-03-11 NOTE — ED Triage Notes (Signed)
BIB [facility] from [facility] in [place]. Per EMS, Facility claims pt usually "combative with them" and today he did not. Pt. Contracted normally, drooling is new. Claim that at 9 am he was fine then by 0945 he was 'not himself". EMS states VS WNL

## 2023-03-11 NOTE — Discharge Instructions (Signed)
It was our pleasure to provide your ER care today - we hope that you feel better.  Encourage patient to drink adequate fluids/stay well hydrated.   Follow up with primary care doctor in the coming week.  Return to ER if worse, new or severe pain, fevers, chest pain, trouble breathing, persistent vomiting, or other concern.

## 2023-03-11 NOTE — ED Provider Notes (Incomplete)
[place] EMERGENCY DEPARTMENT AT [facility] Provider Note   CSN: [id number] Arrival date & time: 03/11/23  1030     History {Add pertinent medical, surgical, social history, OB history to HPI:1} Chief Complaint  Patient presents with   Altered Mental Status    Randy Chandler is a 89 y.o. male.  Pt with hx dementia, presents from SNF with report less active/less responsive than normal. At baseline, advanced dementia, bed bound, contractures, confused. No report of trauma/fall. No report of fevers. Decreased po intake in past day. Pt w dementia - does not answer questions - level 5 caveat. Family indicates is dnr, comfort measures - meds, antibiotics, fluids ok.   The history is provided by the patient, the nursing home, a relative and medical records. The history is limited by the condition of the patient.  Altered Mental Status      Home Medications Prior to Admission medications   Medication Sig Start Date End Date Taking? Authorizing Provider  acetaminophen (TYLENOL) 500 MG tablet Take 1,000 mg by mouth 2 (two) times daily.    [provider]  amLODipine (NORVASC) 2.5 MG tablet Take 2.5 mg by mouth daily.    [provider]  Cholecalciferol (VITAMIN D3) 125 MCG (5000 UT) CAPS Take 1 capsule by mouth daily.    [provider]  dorzolamide-timolol (COSOPT) 22.3-6.8 MG/ML ophthalmic solution Place 1 drop into both eyes 2 (two) times daily.    [provider]  glucose 4 GM chewable tablet Chew 1 tablet (4 g total) by mouth 2 (two) times daily. 11/05/19   Dorcas Carrow, MD  latanoprost (XALATAN) 0.005 % ophthalmic solution Place 1 drop into both eyes at bedtime.    [provider]  lidocaine (LIDODERM) 5 % Place 1 patch onto the skin daily. Remove & Discard patch within 12 hours or as directed by MD 07/13/19   Renne Crigler, PA-C  meloxicam (MOBIC) 7.5 MG tablet Take 7.5 mg by mouth daily.    [provider]  tamsulosin  (FLOMAX) 0.4 MG CAPS capsule Take 0.4 mg by mouth daily after supper.    [provider]      Allergies    Hytrin [terazosin]    Review of Systems   Review of Systems  Unable to perform ROS: Dementia    Physical Exam Updated Vital Signs BP 102/68   Pulse 79   Temp (!) 97.3 F (36.3 C) (Rectal)   Ht 1.676 m (5\' 6" )   Wt 54 kg   SpO2 100%   BMI 19.21 kg/m  Physical Exam Vitals and nursing note reviewed.  Constitutional:      Appearance: Normal appearance. He is well-developed.  HENT:     Head: Atraumatic.     Nose: Nose normal.     Mouth/Throat:     Mouth: Mucous membranes are moist.     Pharynx: Oropharynx is clear.  Eyes:     General: No scleral icterus.    Conjunctiva/sclera: Conjunctivae normal.     Pupils: Pupils are equal, round, and reactive to light.  Neck:     Trachea: No tracheal deviation.  Cardiovascular:     Rate and Rhythm: Normal rate and regular rhythm.     Pulses: Normal pulses.     Heart sounds: Normal heart sounds. No murmur heard.    No friction rub. No gallop.  Pulmonary:     Effort: Pulmonary effort is normal. No accessory muscle usage or respiratory distress.  Breath sounds: Normal breath sounds.  Abdominal:     General: Bowel sounds are normal. There is no distension.     Palpations: Abdomen is soft.     Tenderness: There is no abdominal tenderness.  Genitourinary:    Comments: No cva tenderness. Musculoskeletal:        General: No swelling or tenderness.     Cervical back: Normal range of motion and neck supple. No rigidity.     Comments: Neck supple, no stiffness or rigidity. No focal area of pain or bony tenderness noted. Pt with very small, few mm, superficial ulcer to medial aspect base left great toe/mtp region - no infection.   Skin:    General: Skin is warm and dry.     Findings: No rash.  Neurological:     Mental Status: He is alert.     Comments: Sitting upright. Content appearing. Contractures.      ED  Results / Procedures / Treatments   Labs (all labs ordered are listed, but only abnormal results are displayed) Results for orders placed or performed during the hospital encounter of 03/11/23  Basic metabolic panel  Result Value Ref Range   Sodium 137 135 - 145 mmol/L   Potassium 4.9 3.5 - 5.1 mmol/L   Chloride 101 98 - 111 mmol/L   CO2 27 22 - 32 mmol/L   Glucose, Bld 84 70 - 99 mg/dL   BUN 35 (H) 8 - 23 mg/dL   Creatinine, Ser 4.09 0.61 - 1.24 mg/dL   Calcium 9.2 8.9 - 81.1 mg/dL   GFR, Estimated >91 >47 mL/min   Anion gap 9 5 - 15  CBC  Result Value Ref Range   WBC 6.5 4.0 - 10.5 K/uL   RBC 4.14 (L) 4.22 - 5.81 MIL/uL   Hemoglobin 12.2 (L) 13.0 - 17.0 g/dL   HCT 82.9 (L) 56.2 - 13.0 %   MCV 93.7 80.0 - 100.0 fL   MCH 29.5 26.0 - 34.0 pg   MCHC 31.4 30.0 - 36.0 g/dL   RDW 86.5 78.4 - 69.6 %   Platelets 317 150 - 400 K/uL   nRBC 0.0 0.0 - 0.2 %  Urinalysis, Routine w reflex microscopic -Urine, Catheterized  Result Value Ref Range   Color, Urine STRAW (A) YELLOW   APPearance CLEAR CLEAR   Specific Gravity, Urine 1.010 1.005 - 1.030   pH 7.0 5.0 - 8.0   Glucose, UA NEGATIVE NEGATIVE mg/dL   Hgb urine dipstick NEGATIVE NEGATIVE   Bilirubin Urine NEGATIVE NEGATIVE   Ketones, ur NEGATIVE NEGATIVE mg/dL   Protein, ur NEGATIVE NEGATIVE mg/dL   Nitrite NEGATIVE NEGATIVE   Leukocytes,Ua NEGATIVE NEGATIVE   RBC / HPF 0-5 0 - 5 RBC/hpf   WBC, UA 0-5 0 - 5 WBC/hpf   Bacteria, UA NONE SEEN NONE SEEN   Squamous Epithelial / HPF 0-5 0 - 5 /HPF    EKG None  Radiology No results found.  Procedures Procedures  {Document cardiac monitor, telemetry assessment procedure when appropriate:1}  Medications Ordered in ED Medications  sodium chloride 0.9 % bolus 1,000 mL (0 mLs Intravenous Stopped 03/11/23 1309)    ED Course/ Medical Decision Making/ A&P   {   Click here for ABCD2, HEART and other calculatorsREFRESH Note before signing :1}                          Medical  Decision Making Amount and/or Complexity of Data Reviewed Labs:  ordered.  Iv ns. Continuous pulse ox and cardiac monitoring. Labs ordered/sent.   Differential diagnosis includes dehydration, aki, uti, etc. Dispo decision including potential need for admission considered - will get labs and reassess.   Reviewed nursing notes and prior charts for additional history. External reports reviewed. Additional history from: EMS/family.   Cardiac monitor: sinus rhythm, rate 74.  Labs reviewed/interpreted by me - wbc and hct normal. Chem normal, bun sl elevated ?dehydration. Ivf bolus. UA neg for uti.   Iv ns bolus.   Family indicates pt is comfort care/dnr, would not want any intervention and/or aggressive tx, baseline severe dementia, bedbound w contractures. Agree w transition back to snf.   Pt currently appears comfortable, no acute distress or apparent pain, and although given advanced age/co-morbidities, etc, long term outlook is limited, given goals of care, transition back to snf appears appropriate.   Return precautions provided.      {Document critical care time when appropriate:1} {Document review of labs and clinical decision tools ie heart score, Chads2Vasc2 etc:1}  {Document your independent review of radiology images, and any outside records:1} {Document your discussion with family members, caretakers, and with consultants:1} {Document social determinants of health affecting pt's care:1} {Document your decision making why or why not admission, treatments were needed:1} Final Clinical Impression(s) / ED Diagnoses Final diagnoses:  None    Rx / DC Orders ED Discharge Orders     None

## 2023-03-11 NOTE — ED Notes (Signed)
Pt discharged. Report called and given to facility. Awaiting transportation by EMS currently

## 2023-03-12 NOTE — ED Notes (Signed)
EMS transporting pt back to [facility] in [place]. Pts duaghter Randy Chandler Made aware. Report called

## 2023-03-12 NOTE — ED Notes (Signed)
Pt bed soiled with urine. Bed linens changed, pt washed up and new brief applied. Pt changed into clean hospital gown and pulled up in bed. Pt also offered a apple juice. Vital signs stable. Pt still awaiting EMS transport back to facility. No other issues at this time. Call bell in reach.
